# Patient Record
Sex: Female | Born: 1990 | Race: White | Hispanic: No | Marital: Single | Smoking: Never smoker
Health system: Southern US, Community
[De-identification: ages and names within clinical notes are randomized; demographics above are authoritative.]

## PROBLEM LIST (undated history)

## (undated) DIAGNOSIS — F419 Anxiety disorder, unspecified: Secondary | ICD-10-CM

## (undated) DIAGNOSIS — J45990 Exercise induced bronchospasm: Secondary | ICD-10-CM

## (undated) DIAGNOSIS — Z9889 Other specified postprocedural states: Secondary | ICD-10-CM

## (undated) DIAGNOSIS — R112 Nausea with vomiting, unspecified: Secondary | ICD-10-CM

## (undated) DIAGNOSIS — K589 Irritable bowel syndrome without diarrhea: Secondary | ICD-10-CM

## (undated) DIAGNOSIS — J45909 Unspecified asthma, uncomplicated: Secondary | ICD-10-CM

## (undated) DIAGNOSIS — I959 Hypotension, unspecified: Secondary | ICD-10-CM

## (undated) HISTORY — DX: Irritable bowel syndrome without diarrhea: K58.9

## (undated) HISTORY — DX: Hypotension, unspecified: I95.9

## (undated) HISTORY — DX: Anxiety disorder, unspecified: F41.9

## (undated) HISTORY — DX: Exercise induced bronchospasm: J45.990

## (undated) HISTORY — DX: Irritable bowel syndrome, unspecified: K58.9

---
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
## 2014-05-09 ENCOUNTER — Other Ambulatory Visit: Payer: Self-pay | Admitting: Family Medicine

## 2014-05-09 DIAGNOSIS — R1013 Epigastric pain: Secondary | ICD-10-CM

## 2014-05-16 ENCOUNTER — Ambulatory Visit
Admission: RE | Admit: 2014-05-16 | Discharge: 2014-05-16 | Disposition: A | Discharge status: Home / Self Care | Payer: BC Managed Care – PPO | Source: Ambulatory Visit | Attending: Family Medicine | Admitting: Family Medicine

## 2014-05-16 DIAGNOSIS — R1013 Epigastric pain: Secondary | ICD-10-CM

## 2014-06-06 ENCOUNTER — Other Ambulatory Visit (INDEPENDENT_AMBULATORY_CARE_PROVIDER_SITE_OTHER): Payer: Self-pay | Admitting: General Surgery

## 2014-06-18 ENCOUNTER — Encounter (HOSPITAL_COMMUNITY)
Admission: RE | Admit: 2014-06-18 | Discharge: 2014-06-18 | Disposition: A | Discharge status: Home / Self Care | Payer: BC Managed Care – PPO | Source: Ambulatory Visit | Attending: General Surgery | Admitting: General Surgery

## 2014-06-18 ENCOUNTER — Encounter (HOSPITAL_COMMUNITY): Payer: Self-pay

## 2014-06-18 DIAGNOSIS — Z01812 Encounter for preprocedural laboratory examination: Secondary | ICD-10-CM | POA: Insufficient documentation

## 2014-06-18 HISTORY — DX: Other specified postprocedural states: Z98.890

## 2014-06-18 HISTORY — DX: Unspecified asthma, uncomplicated: J45.909

## 2014-06-18 HISTORY — DX: Nausea with vomiting, unspecified: R11.2

## 2014-06-18 LAB — CBC WITH DIFFERENTIAL/PLATELET
Basophils Absolute: 0 10*3/uL (ref 0.0–0.1)
Basophils Relative: 0 % (ref 0–1)
EOS ABS: 0.1 10*3/uL (ref 0.0–0.7)
EOS PCT: 1 % (ref 0–5)
HCT: 38.2 % (ref 36.0–46.0)
HEMOGLOBIN: 13.2 g/dL (ref 12.0–15.0)
LYMPHS ABS: 1.8 10*3/uL (ref 0.7–4.0)
Lymphocytes Relative: 35 % (ref 12–46)
MCH: 29.3 pg (ref 26.0–34.0)
MCHC: 34.6 g/dL (ref 30.0–36.0)
MCV: 84.7 fL (ref 78.0–100.0)
MONOS PCT: 6 % (ref 3–12)
Monocytes Absolute: 0.3 10*3/uL (ref 0.1–1.0)
Neutro Abs: 3 10*3/uL (ref 1.7–7.7)
Neutrophils Relative %: 58 % (ref 43–77)
Platelets: 202 10*3/uL (ref 150–400)
RBC: 4.51 MIL/uL (ref 3.87–5.11)
RDW: 12.2 % (ref 11.5–15.5)
WBC: 5.2 10*3/uL (ref 4.0–10.5)

## 2014-06-18 LAB — COMPREHENSIVE METABOLIC PANEL WITH GFR
ALT: 57 U/L — ABNORMAL HIGH (ref 0–35)
AST: 39 U/L — ABNORMAL HIGH (ref 0–37)
Albumin: 3.8 g/dL (ref 3.5–5.2)
Alkaline Phosphatase: 51 U/L (ref 39–117)
Anion gap: 14 (ref 5–15)
BUN: 16 mg/dL (ref 6–23)
CO2: 22 meq/L (ref 19–32)
Calcium: 9.2 mg/dL (ref 8.4–10.5)
Chloride: 103 meq/L (ref 96–112)
Creatinine, Ser: 1.01 mg/dL (ref 0.50–1.10)
GFR calc Af Amer: >90 mL/min
GFR calc non Af Amer: 79 mL/min — ABNORMAL LOW
Glucose, Bld: 88 mg/dL (ref 70–99)
Potassium: 3.9 meq/L (ref 3.7–5.3)
Sodium: 139 meq/L (ref 137–147)
Total Bilirubin: 0.7 mg/dL (ref 0.3–1.2)
Total Protein: 7 g/dL (ref 6.0–8.3)

## 2014-06-18 LAB — HCG, SERUM, QUALITATIVE: Preg, Serum: NEGATIVE

## 2014-06-18 NOTE — Pre-Procedure Instructions (Addendum)
Lincoln Brighamaylor Biddinger  06/18/2014   Your procedure is scheduled on:  06/24/14  [facility] short [facility] at 1000 AM.  Call this number if you have problems the morning of surgery: [phone]   Remember:   Do not eat food or drink liquids after midnight.   Take these medicines the morning of surgery with A SIP OF WATER: inhaler if needed(bring with you), birth control    Take all meds as ordered until day of surgery except as instructed below or per dr  Despina AriasSTOP all herbel meds, nsaids (aleve,naproxen,advil,ibuprofen) 5 days prior to surgery starting tomorrow including vitamins, aspirin   Do not wear jewelry, make-up or nail polish.  Do not wear lotions, powders, or perfumes. You may wear deodorant.  Do not shave 48 hours prior to surgery. Men may shave face and neck.  Do not bring valuables to the hospital.  [facility] is not responsible                  for any belongings or valuables.               Contacts, dentures or bridgework may not be worn into surgery.  Leave suitcase in the car. After surgery it may be brought to your room.  For patients admitted to the hospital, discharge time is determined by your                treatment team.               Patients discharged the day of surgery will not be allowed to drive  home.  Name and phone number of your driver:   Special Instructions:  Special Instructions:  - Preparing for Surgery  Before surgery, you can play an important role.  Because skin is not sterile, your skin needs to be as free of germs as possible.  You can reduce the number of germs on you skin by washing with CHG (chlorahexidine gluconate) soap before surgery.  CHG is an antiseptic cleaner which kills germs and bonds with the skin to continue killing germs even after washing.  Please DO NOT use if you have an allergy to CHG or antibacterial soaps.  If your skin becomes reddened/irritated stop using the CHG and inform your nurse when you arrive at  Short Stay.  Do not shave (including legs and underarms) for at least 48 hours prior to the first CHG shower.  You may shave your face.  Please follow these instructions carefully:   1.  Shower with CHG Soap the night before surgery and the morning of Surgery.  2.  If you choose to wash your hair, wash your hair first as usual with your normal shampoo.  3.  After you shampoo, rinse your hair and body thoroughly to remove the Shampoo.  4.  Use CHG as you would any other liquid soap.  You can apply chg directly  to the skin and wash gently with scrungie or a clean washcloth.  5.  Apply the CHG Soap to your body ONLY FROM THE NECK DOWN.  Do not use on open wounds or open sores.  Avoid contact with your eyes ears, mouth and genitals (private parts).  Wash genitals (private parts)       with your normal soap.  6.  Wash thoroughly, paying special attention to the area where your surgery will be performed.  7.  Thoroughly rinse your body with warm water from the neck down.  8.  DO NOT shower/wash with your normal soap after using and rinsing off the CHG Soap.  9.  Pat yourself dry with a clean towel.            10.  Wear clean pajamas.            11.  Place clean sheets on your bed the night of your first shower and do not sleep with pets.  Day of Surgery  Do not apply any lotions/deodorants the morning of surgery.  Please wear clean clothes to the hospital/surgery center.   Please read over the following fact sheets that you were given: Pain Booklet, Coughing and Deep Breathing and Surgical Site Infection Prevention

## 2014-06-23 MED ORDER — CEFAZOLIN SODIUM-DEXTROSE 2-3 GM-% IV SOLR
2.0000 g | INTRAVENOUS | Status: AC
  Administered 2014-06-24: 2 g via INTRAVENOUS
  Filled 2014-06-23: qty 50

## 2014-06-23 NOTE — Anesthesia Preprocedure Evaluation (Addendum)
Anesthesia Evaluation  Patient identified by MRN, date of birth, ID band Patient awake    Reviewed: Allergy & Precautions, H&P , NPO status , Patient's Chart, lab work & pertinent test results, reviewed documented beta blocker date and time   History of Anesthesia Complications (+) PONV  Airway Mallampati: I  TM Distance: >3 FB Neck ROM: Full    Dental  (+) Teeth Intact, Dental Advisory Given   Pulmonary  breath sounds clear to auscultation        Cardiovascular Rhythm:Regular     Neuro/Psych    GI/Hepatic   Endo/Other    Renal/GU      Musculoskeletal   Abdominal (+)  Abdomen: soft. Bowel sounds: normal.  Peds  Hematology   Anesthesia Other Findings   Reproductive/Obstetrics                          Anesthesia Physical Anesthesia Plan  ASA: II  Anesthesia Plan: General   Post-op Pain Management:    Induction: Intravenous  Airway Management Planned: Oral ETT  Additional Equipment:   Intra-op Plan:   Post-operative Plan: Extubation in OR  Informed Consent: I have reviewed the patients History and Physical, chart, labs and discussed the procedure including the risks, benefits and alternatives for the proposed anesthesia with the patient or authorized representative who has indicated his/her understanding and acceptance.     Plan Discussed with:   Anesthesia Plan Comments:         Anesthesia Quick Evaluation

## 2014-06-23 NOTE — H&P (Signed)
Debbie Griffith  [facility] Patient #: [id number] DOB: May 27, 1991 Single / Language: [place] P[place] / Race: White Female      History of Present Illness  Patient words: new pt. symptomatic chole.  The patient is a 23 year old female who presents for evaluation of gall stones. This is a very pleasant 23 year old Caucasian female, referred by Dr. Selena BattenWendy McNeil for management of symptomatic gallstones. Her parents are with her today She is a Holiday representativesenior at [employer]. She had a moderately severe attack of epigastric pain and nausea and retching. This lasted 3 or 4 hours. [facility] ED and was discharged after H. pylori test was negative. Subsequently she has had an ultrasound which shows multiple gallstones and echogenic liver suggesting fatty liver. Lab tests showed a normal CBC, AST 68, ALP 140, normal lipase. She has had 3 subsequent attacks, one of these radiated to the back a little bit. She is always nauseated. This is often after fatty meals. She has avoided attacks recently by staying on a very low-fat diet. She is very healthy. Has exercise-induced asthma. She would like to go ahead with cholecystectomy this fall. I have discussed the indications, details, techniques, and numerous risk of the surgery with her. She is aware of the risk of bleeding, infection, conversion to open laparotomy, injury to adjacent organs with major reconstructive surgery, bile leak, wound hernia, and other unforeseen problems. She understands all these issues well. The style of her questions are answered. She agrees with this plan.   Other Problems Asthma  Past Surgical History  No pertinent past surgical history  Diagnostic Studies History  Colonoscopy never Mammogram never Pap Smear never  Allergies No Known Drug Allergies11/13/2015  Medication History Orsythia (0.1-20MG -MCG Tablet, Oral) Active. ProAir HFA (108 (90 Base)MCG/ACT  Aerosol Soln, Inhalation) Active.  Social History Alcohol use Occasional alcohol use. Caffeine use Tea. No drug use Tobacco use Never smoker.  Family History  Arthritis Mother. Colon Polyps Father. Heart disease in female family member before age 21 Hypertension Father. Prostate Cancer Father.  Pregnancy / Birth History Age at menarche 14 years. Contraceptive History Oral contraceptives. Gravida 0 Para 0 Regular periods  Review of Systems General Present- Appetite Loss, Fatigue and Weight Loss. Not Present- Chills, Fever, Night Sweats and Weight Gain. Skin Not Present- Change in Wart/Mole, Dryness, Hives, Jaundice, New Lesions, Non-Healing Wounds, Rash and Ulcer. HEENT Present- Wears glasses/contact lenses. Not Present- Earache, Hearing Loss, Hoarseness, Nose Bleed, Oral Ulcers, Ringing in the Ears, Seasonal Allergies, Sinus Pain, Sore Throat, Visual Disturbances and Yellow Eyes. Respiratory Not Present- Bloody sputum, Chronic Cough, Difficulty Breathing, Snoring and Wheezing. Breast Not Present- Breast Mass, Breast Pain, Nipple Discharge and Skin Changes. Cardiovascular Not Present- Chest Pain, Difficulty Breathing Lying Down, Leg Cramps, Palpitations, Rapid Heart Rate, Shortness of Breath and Swelling of Extremities. Gastrointestinal Present- Constipation. Not Present- Abdominal Pain, Bloating, Bloody Stool, Change in Bowel Habits, Chronic diarrhea, Difficulty Swallowing, Excessive gas, Gets full quickly at meals, Hemorrhoids, Indigestion, Nausea, Rectal Pain and Vomiting. Female Genitourinary Not Present- Frequency, Nocturia, Painful Urination, Pelvic Pain and Urgency. Musculoskeletal Not Present- Back Pain, Joint Pain, Joint Stiffness, Muscle Pain, Muscle Weakness and Swelling of Extremities. Neurological Not Present- Decreased Memory, Fainting, Headaches, Numbness, Seizures, Tingling, Tremor, Trouble walking and Weakness. Psychiatric Not Present- Anxiety, Bipolar,  Change in Sleep Pattern, Depression, Fearful and Frequent crying. Endocrine Not Present- Cold Intolerance, Excessive Hunger, Hair Changes, Heat Intolerance, Hot flashes and New Diabetes. Hematology Not Present-  Easy Bruising, Excessive bleeding, Gland problems, HIV and Persistent Infections.   Vitals  06/06/2014 4:20 PM Weight: 139.25 lb Height: 66in Body Surface Area: 1.72 m Body Mass Index: 22.48 kg/m Temp.: 98.80F  Pulse: 70 (Regular)  BP: 102/80 (Sitting, Left Arm, Standard)    Physical Exam  General Mental Status-Alert. General Appearance-Consistent with stated age. Hydration-Well hydrated. Voice-Normal.  Head and Neck Head-normocephalic, atraumatic with no lesions or palpable masses.  Eye Eyeball - Bilateral-Extraocular movements intact. Sclera/Conjunctiva - Bilateral-No scleral icterus.  Chest and Lung Exam Chest and lung exam reveals -quiet, even and easy respiratory effort with no use of accessory muscles and on auscultation, normal breath sounds, no adventitious sounds and normal vocal resonance. Inspection Chest Wall - Normal. Back - normal.  Cardiovascular Cardiovascular examination reveals -on palpation PMI is normal in location and amplitude, no palpable S3 or S4. Normal cardiac borders., normal heart sounds, regular rate and rhythm with no murmurs, carotid auscultation reveals no bruits and normal pedal pulses bilaterally.  Abdomen Inspection Inspection of the abdomen reveals - No Hernias. Skin - Scar - no surgical scars. Palpation/Percussion Palpation and Percussion of the abdomen reveal - Soft, Non Tender, No Rebound tenderness, No Rigidity (guarding) and No hepatosplenomegaly. Auscultation Auscultation of the abdomen reveals - Bowel sounds normal.  Neurologic Neurologic evaluation reveals -alert and oriented x 3 with no impairment of recent or remote memory. Mental Status-Normal.  Musculoskeletal Normal Exam -  Left-Upper Extremity Strength Normal and Lower Extremity Strength Normal. Normal Exam - Right-Upper Extremity Strength Normal, Lower Extremity Weakness.    Assessment & Plan GALLSTONES (574.20  K80.20) Current Plans  Schedule for Surgery Your attacks of abdominal pain and nausea are almost certainly due to your gallstones. These attacks will continue until you have an operation to remove your gallbladder. Your elevated liver function tests may be due to your gallbladder, but you should have your liver function test checked 2 or 3 months postop to be sure We have discussed the techniques and risks of this surgery. Read over the patient information booklet. We will schedule the surgery at your convenience in the near future ASTHMA, EXERCISE INDUCED (493.81  J45.990)    Angelia MouldHaywood M. Derrell LollingIngram, M.D., [facility] General and Minimally invasive Surgery Breast and Colorectal Surgery Office:   [phone] Pager:   [phone]

## 2014-06-24 ENCOUNTER — Ambulatory Visit (HOSPITAL_COMMUNITY): Payer: BC Managed Care – PPO | Admitting: Anesthesiology

## 2014-06-24 ENCOUNTER — Encounter (HOSPITAL_COMMUNITY): Payer: Self-pay | Admitting: *Deleted

## 2014-06-24 ENCOUNTER — Ambulatory Visit (HOSPITAL_COMMUNITY): Payer: BC Managed Care – PPO

## 2014-06-24 ENCOUNTER — Ambulatory Visit (HOSPITAL_COMMUNITY)
Admission: RE | Admit: 2014-06-24 | Discharge: 2014-06-24 | Disposition: A | Discharge status: Home / Self Care | Payer: BC Managed Care – PPO | Source: Ambulatory Visit | Attending: General Surgery | Admitting: General Surgery

## 2014-06-24 ENCOUNTER — Encounter (HOSPITAL_COMMUNITY)
Admission: RE | Disposition: A | Discharge status: Home / Self Care | Payer: Self-pay | Source: Ambulatory Visit | Attending: General Surgery

## 2014-06-24 DIAGNOSIS — J45909 Unspecified asthma, uncomplicated: Secondary | ICD-10-CM | POA: Diagnosis not present

## 2014-06-24 DIAGNOSIS — K8020 Calculus of gallbladder without cholecystitis without obstruction: Secondary | ICD-10-CM | POA: Diagnosis present

## 2014-06-24 DIAGNOSIS — Z793 Long term (current) use of hormonal contraceptives: Secondary | ICD-10-CM | POA: Insufficient documentation

## 2014-06-24 DIAGNOSIS — K8010 Calculus of gallbladder with chronic cholecystitis without obstruction: Secondary | ICD-10-CM | POA: Insufficient documentation

## 2014-06-24 DIAGNOSIS — K8080 Other cholelithiasis without obstruction: Secondary | ICD-10-CM | POA: Diagnosis present

## 2014-06-24 HISTORY — PX: CHOLECYSTECTOMY: SHX55

## 2014-06-24 SURGERY — LAPAROSCOPIC CHOLECYSTECTOMY WITH INTRAOPERATIVE CHOLANGIOGRAM
Anesthesia: General | Site: Abdomen

## 2014-06-24 MED ORDER — ACETAMINOPHEN 10 MG/ML IV SOLN
INTRAVENOUS | Status: DC | PRN
  Administered 2014-06-24: 1000 mg via INTRAVENOUS

## 2014-06-24 MED ORDER — SCOPOLAMINE 1 MG/3DAYS TD PT72
1.0000 | MEDICATED_PATCH | TRANSDERMAL | Status: DC
  Administered 2014-06-24: 1 via TRANSDERMAL
  Administered 2014-06-24: 1.5 mg via TRANSDERMAL
  Filled 2014-06-24: qty 1

## 2014-06-24 MED ORDER — CHLORHEXIDINE GLUCONATE 4 % EX LIQD
1.0000 "application " | Freq: Once | CUTANEOUS | Status: DC
  Filled 2014-06-24: qty 15

## 2014-06-24 MED ORDER — MEPERIDINE HCL 25 MG/ML IJ SOLN
6.2500 mg | INTRAMUSCULAR | Status: DC | PRN
Start: 2014-06-24 — End: 2014-06-24

## 2014-06-24 MED ORDER — BUPIVACAINE-EPINEPHRINE (PF) 0.5% -1:200000 IJ SOLN
INTRAMUSCULAR | Status: AC
  Filled 2014-06-24: qty 30

## 2014-06-24 MED ORDER — MIDAZOLAM HCL 5 MG/5ML IJ SOLN
INTRAMUSCULAR | Status: DC | PRN
  Administered 2014-06-24: 2 mg via INTRAVENOUS

## 2014-06-24 MED ORDER — FENTANYL CITRATE 0.05 MG/ML IJ SOLN: 25.0000 ug | INTRAMUSCULAR | Status: DC | PRN

## 2014-06-24 MED ORDER — DEXAMETHASONE SODIUM PHOSPHATE 4 MG/ML IJ SOLN
INTRAMUSCULAR | Status: DC | PRN
  Administered 2014-06-24: 4 mg via INTRAVENOUS

## 2014-06-24 MED ORDER — GLYCOPYRROLATE 0.2 MG/ML IJ SOLN
INTRAMUSCULAR | Status: DC | PRN
  Administered 2014-06-24: 0.4 mg via INTRAVENOUS

## 2014-06-24 MED ORDER — SUCCINYLCHOLINE CHLORIDE 20 MG/ML IJ SOLN
INTRAMUSCULAR | Status: DC | PRN
  Administered 2014-06-24: 100 mg via INTRAVENOUS

## 2014-06-24 MED ORDER — LACTATED RINGERS IV SOLN
INTRAVENOUS | Status: DC | PRN
  Administered 2014-06-24: 11:00:00 via INTRAVENOUS

## 2014-06-24 MED ORDER — PROPOFOL 10 MG/ML IV BOLUS
INTRAVENOUS | Status: AC
  Filled 2014-06-24: qty 20

## 2014-06-24 MED ORDER — FENTANYL CITRATE 0.05 MG/ML IJ SOLN
INTRAMUSCULAR | Status: AC
  Filled 2014-06-24: qty 5

## 2014-06-24 MED ORDER — LACTATED RINGERS IV SOLN
INTRAVENOUS | Status: DC
  Administered 2014-06-24: 11:00:00 via INTRAVENOUS

## 2014-06-24 MED ORDER — ROCURONIUM BROMIDE 100 MG/10ML IV SOLN
INTRAVENOUS | Status: DC | PRN
  Administered 2014-06-24: 30 mg via INTRAVENOUS

## 2014-06-24 MED ORDER — FENTANYL CITRATE 0.05 MG/ML IJ SOLN
INTRAMUSCULAR | Status: DC | PRN
  Administered 2014-06-24 (×3): 50 ug via INTRAVENOUS

## 2014-06-24 MED ORDER — DIPHENHYDRAMINE HCL 50 MG/ML IJ SOLN
INTRAMUSCULAR | Status: DC | PRN
  Administered 2014-06-24: 25 mg via INTRAVENOUS

## 2014-06-24 MED ORDER — PHENYLEPHRINE HCL 10 MG/ML IJ SOLN
INTRAMUSCULAR | Status: DC | PRN
  Administered 2014-06-24 (×2): 40 ug via INTRAVENOUS

## 2014-06-24 MED ORDER — ROCURONIUM BROMIDE 50 MG/5ML IV SOLN
INTRAVENOUS | Status: AC
  Filled 2014-06-24: qty 1

## 2014-06-24 MED ORDER — LIDOCAINE HCL (CARDIAC) 20 MG/ML IV SOLN
INTRAVENOUS | Status: AC
  Filled 2014-06-24: qty 5

## 2014-06-24 MED ORDER — ONDANSETRON HCL 4 MG/2ML IJ SOLN
INTRAMUSCULAR | Status: DC | PRN
  Administered 2014-06-24: 4 mg via INTRAVENOUS

## 2014-06-24 MED ORDER — NEOSTIGMINE METHYLSULFATE 10 MG/10ML IV SOLN
INTRAVENOUS | Status: DC | PRN
  Administered 2014-06-24: 3.5 mg via INTRAVENOUS

## 2014-06-24 MED ORDER — 0.9 % SODIUM CHLORIDE (POUR BTL) OPTIME
TOPICAL | Status: DC | PRN
  Administered 2014-06-24: 1000 mL

## 2014-06-24 MED ORDER — BUPIVACAINE-EPINEPHRINE 0.5% -1:200000 IJ SOLN
INTRAMUSCULAR | Status: DC | PRN
  Administered 2014-06-24: 30 mL

## 2014-06-24 MED ORDER — MIDAZOLAM HCL 2 MG/2ML IJ SOLN
INTRAMUSCULAR | Status: AC
  Filled 2014-06-24: qty 2

## 2014-06-24 MED ORDER — LIDOCAINE HCL (CARDIAC) 20 MG/ML IV SOLN
INTRAVENOUS | Status: DC | PRN
  Administered 2014-06-24: 60 mg via INTRAVENOUS

## 2014-06-24 MED ORDER — SODIUM CHLORIDE 0.9 % IV SOLN
INTRAVENOUS | Status: DC | PRN
  Administered 2014-06-24: 50 mL

## 2014-06-24 MED ORDER — PROPOFOL 10 MG/ML IV BOLUS
INTRAVENOUS | Status: DC | PRN
  Administered 2014-06-24: 150 mg via INTRAVENOUS

## 2014-06-24 MED ORDER — PROMETHAZINE HCL 25 MG/ML IJ SOLN: 6.2500 mg | INTRAMUSCULAR | Status: DC | PRN

## 2014-06-24 MED ORDER — SODIUM CHLORIDE 0.9 % IR SOLN
Status: DC | PRN
  Administered 2014-06-24: 1000 mL

## 2014-06-24 MED ORDER — SUCCINYLCHOLINE CHLORIDE 20 MG/ML IJ SOLN
INTRAMUSCULAR | Status: AC
  Filled 2014-06-24: qty 1

## 2014-06-24 MED ORDER — HYDROCODONE-ACETAMINOPHEN 5-325 MG PO TABS: 1.0000 | ORAL_TABLET | Freq: Four times a day (QID) | ORAL | Status: DC | PRN

## 2014-06-24 SURGICAL SUPPLY — 46 items
APPLIER CLIP ROT 10 11.4 M/L (STAPLE) ×3
BLADE SURG ROTATE 9660 (MISCELLANEOUS) ×3 IMPLANT
CANISTER SUCTION 2500CC (MISCELLANEOUS) ×3 IMPLANT
CHLORAPREP W/TINT 26ML (MISCELLANEOUS) ×3 IMPLANT
CLIP APPLIE ROT 10 11.4 M/L (STAPLE) ×1 IMPLANT
COVER MAYO STAND STRL (DRAPES) ×3 IMPLANT
COVER SURGICAL LIGHT HANDLE (MISCELLANEOUS) ×6 IMPLANT
DECANTER SPIKE VIAL GLASS SM (MISCELLANEOUS) ×6 IMPLANT
DERMABOND ADVANCED (GAUZE/BANDAGES/DRESSINGS) ×2
DERMABOND ADVANCED .7 DNX12 (GAUZE/BANDAGES/DRESSINGS) ×1 IMPLANT
DRAPE C-ARM 42X72 X-RAY (DRAPES) ×3 IMPLANT
DRAPE LAPAROSCOPIC ABDOMINAL (DRAPES) ×3 IMPLANT
DRAPE UTILITY XL STRL (DRAPES) ×6 IMPLANT
ELECT REM PT RETURN 9FT ADLT (ELECTROSURGICAL) ×3
ELECTRODE REM PT RTRN 9FT ADLT (ELECTROSURGICAL) ×1 IMPLANT
GLOVE BIOGEL PI IND STRL 7.0 (GLOVE) ×2 IMPLANT
GLOVE BIOGEL PI IND STRL 7.5 (GLOVE) ×1 IMPLANT
GLOVE BIOGEL PI IND STRL 8 (GLOVE) ×1 IMPLANT
GLOVE BIOGEL PI INDICATOR 7.0 (GLOVE) ×4
GLOVE BIOGEL PI INDICATOR 7.5 (GLOVE) ×2
GLOVE BIOGEL PI INDICATOR 8 (GLOVE) ×2
GLOVE ECLIPSE 8.0 STRL XLNG CF (GLOVE) ×3 IMPLANT
GLOVE EUDERMIC 7 POWDERFREE (GLOVE) ×3 IMPLANT
GLOVE SURG SS PI 7.0 STRL IVOR (GLOVE) ×3 IMPLANT
GOWN STRL REUS W/ TWL LRG LVL3 (GOWN DISPOSABLE) ×3 IMPLANT
GOWN STRL REUS W/ TWL XL LVL3 (GOWN DISPOSABLE) ×1 IMPLANT
GOWN STRL REUS W/TWL LRG LVL3 (GOWN DISPOSABLE) ×6
GOWN STRL REUS W/TWL XL LVL3 (GOWN DISPOSABLE) ×2
KIT BASIN OR (CUSTOM PROCEDURE TRAY) ×3 IMPLANT
KIT ROOM TURNOVER OR (KITS) ×3 IMPLANT
NS IRRIG 1000ML POUR BTL (IV SOLUTION) ×3 IMPLANT
PAD ARMBOARD 7.5X6 YLW CONV (MISCELLANEOUS) ×3 IMPLANT
POUCH SPECIMEN RETRIEVAL 10MM (ENDOMECHANICALS) ×3 IMPLANT
SCISSORS LAP 5X35 DISP (ENDOMECHANICALS) ×3 IMPLANT
SET CHOLANGIOGRAPH 5 50 .035 (SET/KITS/TRAYS/PACK) ×3 IMPLANT
SET IRRIG TUBING LAPAROSCOPIC (IRRIGATION / IRRIGATOR) ×3 IMPLANT
SLEEVE ENDOPATH XCEL 5M (ENDOMECHANICALS) ×6 IMPLANT
SPECIMEN JAR SMALL (MISCELLANEOUS) ×3 IMPLANT
SUT MNCRL AB 4-0 PS2 18 (SUTURE) ×6 IMPLANT
TOWEL OR 17X24 6PK STRL BLUE (TOWEL DISPOSABLE) ×3 IMPLANT
TOWEL OR 17X26 10 PK STRL BLUE (TOWEL DISPOSABLE) ×3 IMPLANT
TRAY LAPAROSCOPIC (CUSTOM PROCEDURE TRAY) ×3 IMPLANT
TROCAR XCEL BLUNT TIP 100MML (ENDOMECHANICALS) ×3 IMPLANT
TROCAR XCEL NON-BLD 11X100MML (ENDOMECHANICALS) ×3 IMPLANT
TROCAR XCEL NON-BLD 5MMX100MML (ENDOMECHANICALS) ×3 IMPLANT
TUBING INSUFFLATION (TUBING) ×3 IMPLANT

## 2014-06-24 NOTE — Anesthesia Procedure Notes (Signed)
Procedure Name: Intubation Date/Time: 06/24/2014 12:10 PM Performed by: Ellin GoodieWEAVER, Ludivina Guymon M Pre-anesthesia Checklist: Patient identified, Emergency Drugs available, Suction available, Patient being monitored and Timeout performed Patient Re-evaluated:Patient Re-evaluated prior to inductionOxygen Delivery Method: Circle system utilized Preoxygenation: Pre-oxygenation with 100% oxygen Intubation Type: IV induction Ventilation: Mask ventilation without difficulty Laryngoscope Size: Mac and 3 Grade View: Grade I Tube type: Oral Tube size: 7.5 mm Number of attempts: 1 Airway Equipment and Method: Stylet Placement Confirmation: ETT inserted through vocal cords under direct vision,  positive ETCO2 and breath sounds checked- equal and bilateral Secured at: 21 cm Tube secured with: Tape Dental Injury: Teeth and Oropharynx as per pre-operative assessment  Comments: Easy atraumatic induction and intubation with MAC 3 blade.  Dr. Berneice HeinrichManny verified placement.  Carlynn HeraldH Weaver, CRNA

## 2014-06-24 NOTE — Discharge Instructions (Signed)
[facility] ______[facility] °LAPAROSCOPIC SURGERY: POST OP INSTRUCTIONS °Always review your discharge instruction sheet given to you by the facility where your surgery was performed. °IF YOU HAVE DISABILITY OR FAMILY LEAVE FORMS, YOU MUST BRING THEM TO THE OFFICE FOR PROCESSING.   °DO NOT GIVE THEM TO YOUR DOCTOR. ° °1. A prescription for pain medication may be given to you upon discharge.  Take your pain medication as prescribed, if needed.  If narcotic pain medicine is not needed, then you may take acetaminophen (Tylenol) or ibuprofen (Advil) as needed. °2. Take your usually prescribed medications unless otherwise directed. °3. If you need a refill on your pain medication, please contact your pharmacy.  They will contact our office to request authorization. Prescriptions will not be filled after 5pm or on week-ends. °4. You should follow a light diet the first few days after arrival home, such as soup and crackers, etc.  Be sure to include lots of fluids daily. °5. Most patients will experience some swelling and bruising in the area of the incisions.  Ice packs will help.  Swelling and bruising can take several days to resolve.  °6. It is common to experience some constipation if taking pain medication after surgery.  Increasing fluid intake and taking a stool softener (such as Colace) will usually help or prevent this problem from occurring.  A mild laxative (Milk of Magnesia or Miralax) should be taken according to package instructions if there are no bowel movements after 48 hours. °7. Unless discharge instructions indicate otherwise, you may remove your bandages 24-48 hours after surgery, and you may shower at that time.  You may have steri-strips (small skin tapes) in place directly over the incision.  These strips should be left on the skin for 7-10 days.  If your surgeon used skin glue on the incision, you may shower in 24 hours.  The glue will flake off over the next 2-3 weeks.  Any sutures or  staples will be removed at the office during your follow-up visit. °8. ACTIVITIES:  You may resume regular (light) daily activities beginning the next day--such as daily self-care, walking, climbing stairs--gradually increasing activities as tolerated.  You may have sexual intercourse when it is comfortable.  Refrain from any heavy lifting or straining until approved by your doctor. °a. You may drive when you are no longer taking prescription pain medication, you can comfortably wear a seatbelt, and you can safely maneuver your car and apply brakes. °b. RETURN TO WORK:  __________________________________________________________ °9. You should see your doctor in the office for a follow-up appointment approximately 2-3 weeks after your surgery.  Make sure that you call for this appointment within a day or two after you arrive home to insure a convenient appointment time. °10. OTHER INSTRUCTIONS: __________________________________________________________________________________________________________________________ __________________________________________________________________________________________________________________________ °WHEN TO CALL YOUR DOCTOR: °1. Fever over 101.0 °2. Inability to urinate °3. Continued bleeding from incision. °4. Increased pain, redness, or drainage from the incision. °5. Increasing abdominal pain ° °The clinic staff is available to answer your questions during regular business hours.  Please don’t hesitate to call and ask to speak to one of the nurses for clinical concerns.  If you have a medical emergency, go to the nearest emergency room or call 911.  A surgeon from [facility] is always on call at the hospital. °[address], [address], [place], [place]  [address] ? [address] [address], , [place]   [address] °[phone] ? [phone] ? FAX [phone] °Web site:   [email or web address] ° °General Anesthesia, Adult, Care After  °Refer to this  sheet in the next few weeks. These instructions provide you with information on caring for yourself after your procedure. Your health care provider may also give you more specific instructions. Your treatment has been planned according to current medical practices, but problems sometimes occur. Call your health care provider if you have any problems or questions after your procedure.  °WHAT TO EXPECT AFTER THE PROCEDURE  °After the procedure, it is typical to experience:  °Sleepiness.  °Nausea and vomiting. °HOME CARE INSTRUCTIONS  °For the first 24 hours after general anesthesia:  °Have a responsible person with you.  °Do not drive a car. If you are alone, do not take public transportation.  °Do not drink alcohol.  °Do not take medicine that has not been prescribed by your health care provider.  °Do not sign important papers or make important decisions.  °You may resume a normal diet and activities as directed by your health care provider.  °Change bandages (dressings) as directed.  °If you have questions or problems that seem related to general anesthesia, call the hospital and ask for the anesthetist or anesthesiologist on call. °SEEK MEDICAL CARE IF:  °You have nausea and vomiting that continue the day after anesthesia.  °You develop a rash. °SEEK IMMEDIATE MEDICAL CARE IF:  °You have difficulty breathing.  °You have chest pain.  °You have any allergic problems. °Document Released: 10/17/2000 Document Revised: 03/13/2013 Document Reviewed: 01/24/2013  °Exit[employer]® Patient Information ©2014 [employer].  ° ° °

## 2014-06-24 NOTE — Op Note (Signed)
Patient Name:           Debbie Griffith   Date of Surgery:        06/24/2014  Pre op Diagnosis:      Chronic cholecystitis with cholelithiasis  Post op Diagnosis:    Same  Procedure:                 Laparoscopic cholecystectomy with cholangiogram  Surgeon:                     Angelia MouldHaywood M. Derrell LollingIngram, M.D., FACS  Assistant:                      Avel Peaceodd Rosenbower, M.D.  Operative Indications:  This is a very pleasant 23 year old Caucasian female, referred by Dr. Selena BattenWendy McNeil for management of symptomatic gallstones. Her parents are with her today She is a Holiday representativesenior at [employer]. She recently had a moderately severe attack of epigastric pain and nausea and retching. This lasted 3 or 4 hours. [facility] and was discharged after H. pylori test was negative. Subsequently she has had an ultrasound which shows multiple gallstones and echogenic liver suggesting fatty liver. Lab tests showed a normal CBC, AST 68, ALP 140, normal lipase. She has had 3 subsequent attacks, one of these radiated to the back a little bit. She is always nauseated. This is often after fatty meals. She has avoided attacks recently by staying on a very low-fat diet. She is very healthy. Has exercise-induced asthma. She would like to go ahead with cholecystectomy.   She is brought to the operating room electively  Operative Findings:       The gallbladder was discolored and chronically inflamed but was thin-walled. The gallbladder contained multiple palpable stones. The cholangiogram was normal, showing normal intrahepatic and extrahepatic biliary anatomy, no filling defects, and no obstruction with good flow of contrast into the duodenum. The liver, stomach, duodenum, small intestine, large intestine were grossly normal to inspection.  Procedure in Detail:          Following the induction of general endotracheal anesthesia the patient's abdomen was prepped and draped in a sterile fashion,  intravenous antibiotics were given, and a surgical timeout was performed. 0.5% Marcaine with epinephrine was used as a local infiltration anesthetic.     A short vertical incision was made inside the lower rim of the umbilicus. The fascia was incised in the midline. The abdominal cavity was entered under direct vision. An 11 mm Hassan trocar was inserted and secured with the Purstring suture of 0 Vicryl. Pneumoperitoneum was created and video camera was inserted. An 11 mm trocar was placed in subxiphoid region and two 5 mm trochars were placed in the right upper quadrant.     The gallbladder fundus was elevated. The infundibulum was retracted laterally. A few adhesions were taken down. The peritoneum overlying the neck of the gallbladder was carefully incised. We could visualize the cystic duct and the common bile duct which was fairly close. We slowly dissected out the cystic duct and the cystic artery and created a nice window behind these 2 structures, creating a good critical view. The cholangiogram catheter was inserted into the cystic duct and the cholangiogram was obtained using the C-arm. The cholangiogram was normal as described above. The cholangiogram catheter was removed, the cystic duct was secured with multiple medical clips and divided. Cystic artery was isolated, secured with multiple medical clips  and divided. Gallbladder was dissected from its bed. Electrocautery was used. About halfway up there was a firm small tubular structure and I wasn't sure if this was an accessory duct but it was clipped and then divided. The gallbladder was further dissected out of its bed placed in a specimen bag and removed. The operative field was irrigated. We cauterized a few small bleeding points until  the irrigation fluid was completely clear.    there was no bleeding or bile leak. Irrigation fluid was removed. The pneumoperitoneum was released. The trochars were removed. The fascia at the umbilicus was closed  with 0 Vicryl sutures. Skin incisions were closed with subcuticular sutures of 4-0 Monocryl and Dermabond. The patient tolerated the procedure well was taken to PACU in stable condition. EBL 10 mL. Counts correct. Complications none.          Angelia MouldHaywood M. Derrell LollingIngram, M.D., FACS General and Minimally Invasive Surgery Breast and Colorectal Surgery  06/24/2014 1:21 PM

## 2014-06-24 NOTE — Interval H&P Note (Signed)
History and Physical Interval Note:  06/24/2014 11:26 AM  Debbie Griffith  has presented today for surgery, with the diagnosis of gallstones  The various methods of treatment have been discussed with the patient and family. After consideration of risks, benefits and other options for treatment, the patient has consented to  Procedure(s): LAPAROSCOPIC CHOLECYSTECTOMY WITH INTRAOPERATIVE CHOLANGIOGRAM POSSIBLE OPEN (N/A) as a surgical intervention .  The patient's history has been reviewed, patient examined, no change in status, stable for surgery.  I have reviewed the patient's chart and labs.  Questions were answered to the patient's satisfaction.     Ernestene MentionINGRAM,[name] M

## 2014-06-24 NOTE — Interval H&P Note (Signed)
History and Physical Interval Note:  06/24/2014 11:26 AM  Debbie Griffith  has presented today for surgery, with the diagnosis of gallstones  The various methods of treatment have been discussed with the patient and family. After consideration of risks, benefits and other options for treatment, the patient has consented to  Procedure(s): LAPAROSCOPIC CHOLECYSTECTOMY WITH INTRAOPERATIVE CHOLANGIOGRAM POSSIBLE OPEN (N/A) as a surgical intervention .  The patient's history has been reviewed, patient examined, no change in status, stable for surgery.  I have reviewed the patient's chart and labs.  Questions were answered to the patient's satisfaction.     Thedora Rings M   

## 2014-06-24 NOTE — Transfer of Care (Signed)
Immediate Anesthesia Transfer of Care Note  Patient: Debbie Griffith  Procedure(s) Performed: Procedure(s): LAPAROSCOPIC CHOLECYSTECTOMY WITH INTRAOPERATIVE CHOLANGIOGRAM  (N/A)  Patient Location: PACU  Anesthesia Type:General  Level of Consciousness: awake, alert  and sedated  Airway & Oxygen Therapy: Patient connected to nasal cannula oxygen  Post-op Assessment: Report given to PACU RN  Post vital signs: stable  Complications: No apparent anesthesia complications

## 2014-06-24 NOTE — Anesthesia Postprocedure Evaluation (Signed)
  Anesthesia Post-op Note  Patient: Debbie Griffith  Procedure(s) Performed: Procedure(s): LAPAROSCOPIC CHOLECYSTECTOMY WITH INTRAOPERATIVE CHOLANGIOGRAM  (N/A)  Patient Location: PACU  Anesthesia Type:General  Level of Consciousness: awake and alert   Airway and Oxygen Therapy: Patient Spontanous Breathing and Patient connected to nasal cannula oxygen  Post-op Pain: mild  Post-op Assessment: Post-op Vital signs reviewed, Patient's Cardiovascular Status Stable and Respiratory Function Stable  Post-op Vital Signs: Reviewed and stable  Last Vitals:  Filed Vitals:   06/24/14 1345  BP: 107/57  Pulse: 53  Temp:   Resp: 12    Complications: No apparent anesthesia complications

## 2014-06-25 ENCOUNTER — Encounter (HOSPITAL_COMMUNITY): Payer: Self-pay | Admitting: General Surgery

## 2014-12-25 ENCOUNTER — Other Ambulatory Visit: Payer: Self-pay | Admitting: Physician Assistant

## 2014-12-25 ENCOUNTER — Other Ambulatory Visit (HOSPITAL_COMMUNITY)
Admission: RE | Admit: 2014-12-25 | Discharge: 2014-12-25 | Disposition: A | Discharge status: Home / Self Care | Payer: BLUE CROSS/BLUE SHIELD | Source: Ambulatory Visit | Attending: Physician Assistant | Admitting: Physician Assistant

## 2014-12-25 DIAGNOSIS — Z124 Encounter for screening for malignant neoplasm of cervix: Secondary | ICD-10-CM | POA: Diagnosis present

## 2014-12-25 DIAGNOSIS — Z113 Encounter for screening for infections with a predominantly sexual mode of transmission: Secondary | ICD-10-CM | POA: Insufficient documentation

## 2014-12-29 LAB — CYTOLOGY - PAP

## 2016-01-01 DIAGNOSIS — R198 Other specified symptoms and signs involving the digestive system and abdomen: Secondary | ICD-10-CM | POA: Insufficient documentation

## 2016-01-01 DIAGNOSIS — J45990 Exercise induced bronchospasm: Secondary | ICD-10-CM | POA: Insufficient documentation

## 2016-01-01 DIAGNOSIS — Z9049 Acquired absence of other specified parts of digestive tract: Secondary | ICD-10-CM | POA: Insufficient documentation

## 2016-01-14 ENCOUNTER — Encounter: Payer: Self-pay | Admitting: *Deleted

## 2016-01-14 DIAGNOSIS — K589 Irritable bowel syndrome without diarrhea: Secondary | ICD-10-CM | POA: Insufficient documentation

## 2016-01-14 DIAGNOSIS — J45990 Exercise induced bronchospasm: Secondary | ICD-10-CM | POA: Insufficient documentation

## 2016-01-28 ENCOUNTER — Institutional Professional Consult (permissible substitution): Payer: BLUE CROSS/BLUE SHIELD | Admitting: Internal Medicine

## 2016-07-26 ENCOUNTER — Encounter: Payer: Self-pay | Admitting: Certified Nurse Midwife

## 2016-07-26 ENCOUNTER — Ambulatory Visit (INDEPENDENT_AMBULATORY_CARE_PROVIDER_SITE_OTHER): Payer: BLUE CROSS/BLUE SHIELD | Admitting: Certified Nurse Midwife

## 2016-07-26 VITALS — BP 110/80 | HR 76 | Resp 14 | Ht 66.5 in | Wt 131.0 lb

## 2016-07-26 DIAGNOSIS — Z124 Encounter for screening for malignant neoplasm of cervix: Secondary | ICD-10-CM | POA: Diagnosis not present

## 2016-07-26 DIAGNOSIS — Z01419 Encounter for gynecological examination (general) (routine) without abnormal findings: Secondary | ICD-10-CM | POA: Diagnosis not present

## 2016-07-26 DIAGNOSIS — Z3041 Encounter for surveillance of contraceptive pills: Secondary | ICD-10-CM | POA: Diagnosis not present

## 2016-07-26 MED ORDER — LEVONORGESTREL-ETHINYL ESTRAD 0.1-20 MG-MCG PO TABS: 1.0000 | ORAL_TABLET | Freq: Every day | ORAL | 4 refills | Status: DC

## 2016-07-26 NOTE — Patient Instructions (Addendum)
General topics  Next pap or exam is  due in 1 year Take a Women's multivitamin Take 1200 mg. of calcium daily - prefer dietary If any concerns in interim to call back  Breast Self-Awareness Practicing breast self-awareness may pick up problems early, prevent significant medical complications, and possibly save your life. By practicing breast self-awareness, you can become familiar with how your breasts look and feel and if your breasts are changing. This allows you to notice changes early. It can also offer you some reassurance that your breast health is good. One way to learn what is normal for your breasts and whether your breasts are changing is to do a breast self-exam. If you find a lump or something that was not present in the past, it is best to contact your caregiver right away. Other findings that should be evaluated by your caregiver include nipple discharge, especially if it is bloody; skin changes or reddening; areas where the skin seems to be pulled in (retracted); or new lumps and bumps. Breast pain is seldom associated with cancer (malignancy), but should also be evaluated by a caregiver. BREAST SELF-EXAM The best time to examine your breasts is 5 7 days after your menstrual period is over.  ExitCare Patient Information 2013 [employer].   Exercise to Stay Healthy Exercise helps you become and stay healthy. EXERCISE IDEAS AND TIPS Choose exercises that:  You enjoy.  Fit into your day. You do not need to exercise really hard to be healthy. You can do exercises at a slow or medium level and stay healthy. You can:  Stretch before and after working out.  Try yoga, Pilates, or tai chi.  Lift weights.  Walk fast, swim, jog, run, climb stairs, bicycle, dance, or rollerskate.  Take aerobic classes. Exercises that burn about 150 calories:  Running 1  miles in 15 minutes.  Playing volleyball for 45 to 60 minutes.  Washing and waxing a car for 45 to 60  minutes.  Playing touch football for 45 minutes.  Walking 1  miles in 35 minutes.  Pushing a stroller 1  miles in 30 minutes.  Playing basketball for 30 minutes.  Raking leaves for 30 minutes.  Bicycling 5 miles in 30 minutes.  Walking 2 miles in 30 minutes.  Dancing for 30 minutes.  Shoveling snow for 15 minutes.  Swimming laps for 20 minutes.  Walking up stairs for 15 minutes.  Bicycling 4 miles in 15 minutes.  Gardening for 30 to 45 minutes.  Jumping rope for 15 minutes.  Washing windows or floors for 45 to 60 minutes. Document Released: 08/13/2010 Document Revised: 10/03/2011 Document Reviewed: 08/13/2010 ExitCare Patient Information 2013 [employer].   Other topics ( that may be useful information):    Sexually Transmitted Disease Sexually transmitted disease (STD) refers to any infection that is passed from person to person during sexual activity. This may happen by way of saliva, semen, blood, vaginal mucus, or urine. Common STDs include:  Gonorrhea.  Chlamydia.  Syphilis.  HIV/AIDS.  Genital herpes.  Hepatitis B and C.  Trichomonas.  Human papillomavirus (HPV).  Pubic lice. CAUSES  An STD may be spread by bacteria, virus, or parasite. A person can get an STD by:  Sexual intercourse with an infected person.  Sharing sex toys with an infected person.  Sharing needles with an infected person.  Having intimate contact with the genitals, mouth, or rectal areas of an infected person. SYMPTOMS  Some people may not have any symptoms, but   they can still pass the infection to others. Different STDs have different symptoms. Symptoms include:  Painful or bloody urination.  Pain in the pelvis, abdomen, vagina, anus, throat, or eyes.  Skin rash, itching, irritation, growths, or sores (lesions). These usually occur in the genital or anal area.  Abnormal vaginal discharge.  Penile discharge in men.  Soft, flesh-colored skin growths in the  genital or anal area.  Fever.  Pain or bleeding during sexual intercourse.  Swollen glands in the groin area.  Yellow skin and eyes (jaundice). This is seen with hepatitis. DIAGNOSIS  To make a diagnosis, your caregiver may:  Take a medical history.  Perform a physical exam.  Take a specimen (culture) to be examined.  Examine a sample of discharge under a microscope.  Perform blood test TREATMENT   Chlamydia, gonorrhea, trichomonas, and syphilis can be cured with antibiotic medicine.  Genital herpes, hepatitis, and HIV can be treated, but not cured, with prescribed medicines. The medicines will lessen the symptoms.  Genital warts from HPV can be treated with medicine or by freezing, burning (electrocautery), or surgery. Warts may come back.  HPV is a virus and cannot be cured with medicine or surgery.However, abnormal areas may be followed very closely by your caregiver and may be removed from the cervix, vagina, or vulva through office procedures or surgery. If your diagnosis is confirmed, your recent sexual partners need treatment. This is true even if they are symptom-free or have a negative culture or evaluation. They should not have sex until their caregiver says it is okay. HOME CARE INSTRUCTIONS  All sexual partners should be informed, tested, and treated for all STDs.  Take your antibiotics as directed. Finish them even if you start to feel better.  Only take over-the-counter or prescription medicines for pain, discomfort, or fever as directed by your caregiver.  Rest.  Eat a balanced diet and drink enough fluids to keep your urine clear or pale yellow.  Do not have sex until treatment is completed and you have followed up with your caregiver. STDs should be checked after treatment.  Keep all follow-up appointments, Pap tests, and blood tests as directed by your caregiver.  Only use latex condoms and water-soluble lubricants during sexual activity. Do not use  petroleum jelly or oils.  Avoid alcohol and illegal drugs.  Get vaccinated for HPV and hepatitis. If you have not received these vaccines in the past, talk to your caregiver about whether one or both might be right for you.  Avoid risky sex practices that can break the skin. The only way to avoid getting an STD is to avoid all sexual activity.Latex condoms and dental dams (for oral sex) will help lessen the risk of getting an STD, but will not completely eliminate the risk. SEEK MEDICAL CARE IF:   You have a fever.  You have any new or worsening symptoms. Document Released: 10/01/2002 Document Revised: 10/03/2011 Document Reviewed: 10/08/2010 [facility] Patient Information 2013 [employer].    Domestic Abuse You are being battered or abused if someone close to you hits, pushes, or physically hurts you in any way. You also are being abused if you are forced into activities. You are being sexually abused if you are forced to have sexual contact of any kind. You are being emotionally abused if you are made to feel worthless or if you are constantly threatened. It is important to remember that help is available. No one has the right to abuse you. PREVENTION OF FURTHER  ABUSE  Learn the warning signs of danger. This varies with situations but may include: the use of alcohol, threats, isolation from friends and family, or forced sexual contact. Leave if you feel that violence is going to occur.  If you are attacked or beaten, report it to the police so the abuse is documented. You do not have to press charges. The police can protect you while you or the attackers are leaving. Get the officer's name and badge number and a copy of the report.  Find someone you can trust and tell them what is happening to you: your caregiver, a nurse, clergy member, close friend or family member. Feeling ashamed is natural, but remember that you have done nothing wrong. No one deserves abuse. Document Released:  07/08/2000 Document Revised: 10/03/2011 Document Reviewed: 09/16/2010 ExitCare Patient Information 2013 [employer].    How Much is Too Much Alcohol? Drinking too much alcohol can cause injury, accidents, and health problems. These types of problems can include:   Car crashes.  Falls.  Family fighting (domestic violence).  Drowning.  Fights.  Injuries.  Burns.  Damage to certain organs.  Having a baby with birth defects. ONE DRINK CAN BE TOO MUCH WHEN YOU ARE:  Working.  Pregnant or breastfeeding.  Taking medicines. Ask your doctor.  Driving or planning to drive. If you or someone you know has a drinking problem, get help from a doctor.  Document Released: 05/07/2009 Document Revised: 10/03/2011 Document Reviewed: 05/07/2009 ExitCare Patient Information 2013 [employer].   Smoking Hazards Smoking cigarettes is extremely bad for your health. Tobacco smoke has over 200 known poisons in it. There are over 60 chemicals in tobacco smoke that cause cancer. Some of the chemicals found in cigarette smoke include:   Cyanide.  Benzene.  Formaldehyde.  Methanol (wood alcohol).  Acetylene (fuel used in welding torches).  Ammonia. Cigarette smoke also contains the poisonous gases nitrogen oxide and carbon monoxide.  Cigarette smokers have an increased risk of many serious medical problems and Smoking causes approximately:  90% of all lung cancer deaths in men.  80% of all lung cancer deaths in women.  90% of deaths from chronic obstructive lung disease. Compared with nonsmokers, smoking increases the risk of:  Coronary heart disease by 2 to 4 times.  Stroke by 2 to 4 times.  Men developing lung cancer by 23 times.  Women developing lung cancer by 13 times.  Dying from chronic obstructive lung diseases by 12 times.  . Smoking is the most preventable cause of death and disease in our society.  WHY IS SMOKING ADDICTIVE?  Nicotine is the chemical  agent in tobacco that is capable of causing addiction or dependence.  When you smoke and inhale, nicotine is absorbed rapidly into the bloodstream through your lungs. Nicotine absorbed through the lungs is capable of creating a powerful addiction. Both inhaled and non-inhaled nicotine may be addictive.  Addiction studies of cigarettes and spit tobacco show that addiction to nicotine occurs mainly during the teen years, when young people begin using tobacco products. WHAT ARE THE BENEFITS OF QUITTING?  There are many health benefits to quitting smoking.   Likelihood of developing cancer and heart disease decreases. Health improvements are seen almost immediately.  Blood pressure, pulse rate, and breathing patterns start returning to normal soon after quitting. QUITTING SMOKING   American Lung Association - 1-800-LUNGUSA  American Cancer Society - 1-800-ACS-2345 Document Released: 08/18/2004 Document Revised: 10/03/2011 Document Reviewed: 04/22/2009 ExitCare Patient Information 2013 Exit[employer].   Stress Management Stress is a state of physical or mental tension that often results from changes in your life or normal routine. Some common causes of stress are:  Death of a loved one.  Injuries or severe illnesses.  Getting fired or changing jobs.  Moving into a new home. Other causes may be:  Sexual problems.  Business or financial losses.  Taking on a large debt.  Regular conflict with someone at home or at work.  Constant tiredness from lack of sleep. It is not just bad things that are stressful. It may be stressful to:  Win the lottery.  Get married.  Buy a new car. The amount of stress that can be easily tolerated varies from person to person. Changes generally cause stress, regardless of the types of change. Too much stress can affect your health. It may lead to physical or emotional problems. Too little stress (boredom) may also become stressful. SUGGESTIONS TO  REDUCE STRESS:  Talk things over with your family and friends. It often is helpful to share your concerns and worries. If you feel your problem is serious, you may want to get help from a professional counselor.  Consider your problems one at a time instead of lumping them all together. Trying to take care of everything at once may seem impossible. List all the things you need to do and then start with the most important one. Set a goal to accomplish 2 or 3 things each day. If you expect to do too many in a single day you will naturally fail, causing you to feel even more stressed.  Do not use alcohol or drugs to relieve stress. Although you may feel better for a short time, they do not remove the problems that caused the stress. They can also be habit forming.  Exercise regularly - at least 3 times per week. Physical exercise can help to relieve that "uptight" feeling and will relax you.  The shortest distance between despair and hope is often a good night's sleep.  Go to bed and get up on time allowing yourself time for appointments without being rushed.  Take a short "time-out" period from any stressful situation that occurs during the day. Close your eyes and take some deep breaths. Starting with the muscles in your face, tense them, hold it for a few seconds, then relax. Repeat this with the muscles in your neck, shoulders, hand, stomach, back and legs.  Take good care of yourself. Eat a balanced diet and get plenty of rest.  Schedule time for having fun. Take a break from your daily routine to relax. HOME CARE INSTRUCTIONS   Call if you feel overwhelmed by your problems and feel you can no longer manage them on your own.  Return immediately if you feel like hurting yourself or someone else. Document Released: 01/04/2001 Document Revised: 10/03/2011 Document Reviewed: 08/27/2007 [facility] Patient Information 2013 [employer].   Oral Contraception Use Oral contraceptive pills  (OCPs) are medicines taken to prevent pregnancy. OCPs work by preventing the ovaries from releasing eggs. The hormones in OCPs also cause the cervical mucus to thicken, preventing the sperm from entering the uterus. The hormones also cause the uterine lining to become thin, not allowing a fertilized egg to attach to the inside of the uterus. OCPs are highly effective when taken exactly as prescribed. However, OCPs do not prevent sexually transmitted diseases (STDs). Safe sex practices, such as using condoms along with an OCP, can help prevent STDs.  Before taking OCPs, you may have a physical exam and Pap test. Your health care provider may also order blood tests if necessary. Your health care provider will make sure you are a good candidate for oral contraception. Discuss with your health care provider the possible side effects of the OCP you may be prescribed. When starting an OCP, it can take 2 to 3 months for the body to adjust to the changes in hormone levels in your body. How to take oral contraceptive pills Your health care provider may advise you on how to start taking the first cycle of OCPs. Otherwise, you can:  Start on day 1 of your menstrual period. You will not need any backup contraceptive protection with this start time.  Start on the first Sunday after your menstrual period or the day you get your prescription. In these cases, you will need to use backup contraceptive protection for the first week.  Start the pill at any time of your cycle. If you take the pill within 5 days of the start of your period, you are protected against pregnancy right away. In this case, you will not need a backup form of birth control. If you start at any other time of your menstrual cycle, you will need to use another form of birth control for 7 days. If your OCP is the type called a minipill, it will protect you from pregnancy after taking it for 2 days (48 hours).  After you have started taking OCPs:  If  you forget to take 1 pill, take it as soon as you remember. Take the next pill at the regular time.  If you miss 2 or more pills, call your health care provider because different pills have different instructions for missed doses. Use backup birth control until your next menstrual period starts.  If you use a 28-day pack that contains inactive pills and you miss 1 of the last 7 pills (pills with no hormones), it will not matter. Throw away the rest of the non-hormone pills and start a new pill pack.  No matter which day you start the OCP, you will always start a new pack on that same day of the week. Have an extra pack of OCPs and a backup contraceptive method available in case you miss some pills or lose your OCP pack. Follow these instructions at home:  Do not smoke.  Always use a condom to protect against STDs. OCPs do not protect against STDs.  Use a calendar to mark your menstrual period days.  Read the information and directions that came with your OCP. Talk to your health care provider if you have questions. Contact a health care provider if:  You develop nausea and vomiting.  You have abnormal vaginal discharge or bleeding.  You develop a rash.  You miss your menstrual period.  You are losing your hair.  You need treatment for mood swings or depression.  You get dizzy when taking the OCP.  You develop acne from taking the OCP.  You become pregnant. Get help right away if:  You develop chest pain.  You develop shortness of breath.  You have an uncontrolled or severe headache.  You develop numbness or slurred speech.  You develop visual problems.  You develop pain, redness, and swelling in the legs. This information is not intended to replace advice given to you by your health care provider. Make sure you discuss any questions you have with your health care provider. Document Released: 06/30/2011   Document Revised: 12/17/2015 Document Reviewed:  12/30/2012 [employer] Interactive Patient Education  2017 [employer]  

## 2016-07-26 NOTE — Progress Notes (Signed)
26 y.o. G0P0000 Single  Caucasian Fe here to establish gyn care and  for annual exam. Last aex 12/25/14. Periods very regular, heavy days 3-4 and light and stops. Mild cramping. OCP use for cycle control since 2012. Currently sexually active.. New partner, but screened for STD and never has been treated for one. Sees GI for IBS management. Sees PCP as needed. No other health issues today.  Patient's last menstrual period was 07/05/2016 (approximate).          Sexually active: Yes.    The current method of family planning is OCP. (estrogen/progesterone).    Exercising: Yes.    running, weights, yoga Smoker:  no  Health Maintenance: Pap:  12/25/14 Neg  MMG:  Never Colonoscopy:  Flexible sigmoidoscopy 09/2015: Normal , GI issues from Gallbladder surgery(stones) BMD:   Never TDaP:  2016  Hep C and HIV: Never Labs: PCP   reports that she has never smoked. She has never used smokeless tobacco. She reports that she drinks about 0.6 oz of alcohol per week . She reports that she does not use drugs.  Past Medical History:  Diagnosis Date  . Anxiety   . Asthma    exersize induced  . Exercise induced bronchospasm   . IBS (irritable bowel syndrome)   . Low BP   . PONV (postoperative nausea and vomiting)    patient "passes out when having  blood drawn if not lying down,sometimes passes out anyway"    Past Surgical History:  Procedure Laterality Date  . CHOLECYSTECTOMY N/A 06/24/2014   Procedure: LAPAROSCOPIC CHOLECYSTECTOMY WITH INTRAOPERATIVE CHOLANGIOGRAM ;  Surgeon: Claud KelpHaywood Ingram, MD;  Location: [facility] OR;  Service: General;  Laterality: N/A;  . WISDOM TOOTH EXTRACTION  11    Current Outpatient Prescriptions  Medication Sig Dispense Refill  . albuterol (PROVENTIL HFA;VENTOLIN HFA) 108 (90 BASE) MCG/ACT inhaler Inhale 1-2 puffs into the lungs every 6 (six) hours as needed for wheezing or shortness of breath.    . levonorgestrel-ethinyl estradiol (AVIANE,ALESSE,LESSINA) 0.1-20 MG-MCG tablet Take 1  tablet by mouth daily.    . Nutritional Supplements (ADULT NUTRITIONAL SUPPLEMENT PO) Take by mouth. ARYTHIN supplement to aid digestion    . Probiotic Product (PROBIOTIC-10 PO) Take by mouth daily.     No current facility-administered medications for this visit.     Family History  Problem Relation Age of Onset  . Healthy Mother   . Prostate cancer Father   . Colonic polyp Father   . Healthy Sister   . Colon cancer Neg Hx   . Kidney disease Neg Hx   . Liver disease Neg Hx     ROS:  Pertinent items are noted in HPI.  Otherwise, a comprehensive ROS was negative.  Exam:   BP 110/80 (BP Location: Right Arm, Patient Position: Sitting, Cuff Size: Normal)   Pulse 76   Resp 14   Ht 5' 6.5" (1.689 m)   Wt 131 lb (59.4 kg)   LMP 07/05/2016 (Approximate)   BMI 20.83 kg/m  Height: 5' 6.5" (168.9 cm) Ht Readings from Last 3 Encounters:  07/26/16 5' 6.5" (1.689 m)  06/18/14 5\' 6"  (1.676 m)    General appearance: alert, cooperative and appears stated age Head: Normocephalic, without obvious abnormality, atraumatic Neck: no adenopathy, supple, symmetrical, trachea midline and thyroid normal to inspection and palpation Lungs: clear to auscultation bilaterally Breasts: normal appearance, no masses or tenderness, No nipple retraction or dimpling, No nipple discharge or bleeding, No axillary or supraclavicular adenopathy Heart: regular rate  and rhythm Abdomen: soft, non-tender; no masses,  no organomegaly Extremities: extremities normal, atraumatic, no cyanosis or edema Skin: Skin color, texture, turgor normal. No rashes or lesions Lymph nodes: Cervical, supraclavicular, and axillary nodes normal. No abnormal inguinal nodes palpated Neurologic: Grossly normal   Pelvic: External genitalia:  no lesions              Urethra:  normal appearing urethra with no masses, tenderness or lesions              Bartholin's and Skene's: normal                 Vagina: normal appearing vagina with  normal color and discharge, no lesions              Cervix: no cervical motion tenderness, no lesions and nulliparous appearance              Pap taken: Yes.   Bimanual Exam:  Uterus:  normal size, contour, position, consistency, mobility, non-tender              Adnexa: normal adnexa and no mass, fullness, tenderness               Rectovaginal: Confirms               Anus:  normal sphincter tone, no lesions  Chaperone present: yes  A:  Well Woman with normal exam  Contraception OCP desired for cycle control also  History of Gall stones with cholecystectomy  IBS with GI management  P:   Reviewed health and wellness pertinent to exam  Reviewed risks/benefits and side effects of OCP and need to advise if problems occur.  Rx Orsynthia see order with instructions  Pap smear as above with HPV reflex  Continue follow up with MD as indicated.   counseled on breast self exam, diet and exercise, calcium and vitamin D in diet, STD prevention.  return annually or prn  An After Visit Summary was printed and given to the patient.

## 2016-07-28 LAB — IPS PAP TEST WITH REFLEX TO HPV

## 2016-07-29 NOTE — Progress Notes (Signed)
Encounter reviewed Rasheedah Reis, MD   

## 2016-08-18 ENCOUNTER — Emergency Department (HOSPITAL_BASED_OUTPATIENT_CLINIC_OR_DEPARTMENT_OTHER): Payer: BLUE CROSS/BLUE SHIELD

## 2016-08-18 ENCOUNTER — Emergency Department (HOSPITAL_BASED_OUTPATIENT_CLINIC_OR_DEPARTMENT_OTHER)
Admission: EM | Admit: 2016-08-18 | Discharge: 2016-08-18 | Disposition: A | Discharge status: Home / Self Care | Payer: BLUE CROSS/BLUE SHIELD | Attending: Emergency Medicine | Admitting: Emergency Medicine

## 2016-08-18 ENCOUNTER — Encounter (HOSPITAL_BASED_OUTPATIENT_CLINIC_OR_DEPARTMENT_OTHER): Payer: Self-pay | Admitting: *Deleted

## 2016-08-18 DIAGNOSIS — J45909 Unspecified asthma, uncomplicated: Secondary | ICD-10-CM | POA: Insufficient documentation

## 2016-08-18 DIAGNOSIS — R1031 Right lower quadrant pain: Secondary | ICD-10-CM | POA: Diagnosis present

## 2016-08-18 DIAGNOSIS — Z79899 Other long term (current) drug therapy: Secondary | ICD-10-CM | POA: Diagnosis not present

## 2016-08-18 DIAGNOSIS — N390 Urinary tract infection, site not specified: Secondary | ICD-10-CM | POA: Insufficient documentation

## 2016-08-18 LAB — BASIC METABOLIC PANEL
ANION GAP: 10 (ref 5–15)
BUN: 10 mg/dL (ref 6–20)
CHLORIDE: 101 mmol/L (ref 101–111)
CO2: 22 mmol/L (ref 22–32)
CREATININE: 1.01 mg/dL — AB (ref 0.44–1.00)
Calcium: 9.2 mg/dL (ref 8.9–10.3)
GFR calc non Af Amer: >60 mL/min (ref >60)
Glucose, Bld: 98 mg/dL (ref 65–99)
Potassium: 4 mmol/L (ref 3.5–5.1)
Sodium: 133 mmol/L — ABNORMAL LOW (ref 135–145)

## 2016-08-18 LAB — CBC WITH DIFFERENTIAL/PLATELET
BASOS PCT: 0 %
Basophils Absolute: 0 10*3/uL (ref 0.0–0.1)
Eosinophils Absolute: 0 10*3/uL (ref 0.0–0.7)
Eosinophils Relative: 0 %
HCT: 38.9 % (ref 36.0–46.0)
HEMOGLOBIN: 13 g/dL (ref 12.0–15.0)
Lymphocytes Relative: 9 %
Lymphs Abs: 1.1 10*3/uL (ref 0.7–4.0)
MCH: 28.3 pg (ref 26.0–34.0)
MCHC: 33.4 g/dL (ref 30.0–36.0)
MCV: 84.7 fL (ref 78.0–100.0)
MONOS PCT: 4 %
Monocytes Absolute: 0.5 10*3/uL (ref 0.1–1.0)
NEUTROS ABS: 11.2 10*3/uL — AB (ref 1.7–7.7)
NEUTROS PCT: 87 %
Platelets: 244 10*3/uL (ref 150–400)
RBC: 4.59 MIL/uL (ref 3.87–5.11)
RDW: 12.1 % (ref 11.5–15.5)
WBC: 12.8 10*3/uL — AB (ref 4.0–10.5)

## 2016-08-18 LAB — URINALYSIS, ROUTINE W REFLEX MICROSCOPIC
Bilirubin Urine: NEGATIVE
GLUCOSE, UA: NEGATIVE mg/dL
Nitrite: POSITIVE — AB
Specific Gravity, Urine: 1.02 (ref 1.005–1.030)
pH: 6.5 (ref 5.0–8.0)

## 2016-08-18 LAB — URINALYSIS, MICROSCOPIC (REFLEX)

## 2016-08-18 LAB — PREGNANCY, URINE: Preg Test, Ur: NEGATIVE

## 2016-08-18 MED ORDER — ONDANSETRON HCL 4 MG/2ML IJ SOLN
4.0000 mg | Freq: Once | INTRAMUSCULAR | Status: DC
  Filled 2016-08-18: qty 2

## 2016-08-18 MED ORDER — DEXTROSE 5 % IV SOLN
1.0000 g | Freq: Once | INTRAVENOUS | Status: AC
  Administered 2016-08-18: 1 g via INTRAVENOUS
  Filled 2016-08-18: qty 10

## 2016-08-18 MED ORDER — IOPAMIDOL (ISOVUE-300) INJECTION 61%
100.0000 mL | Freq: Once | INTRAVENOUS | Status: AC | PRN
  Administered 2016-08-18: 100 mL via INTRAVENOUS

## 2016-08-18 MED ORDER — ONDANSETRON HCL 4 MG/2ML IJ SOLN
4.0000 mg | Freq: Once | INTRAMUSCULAR | Status: AC
  Administered 2016-08-18: 4 mg via INTRAVENOUS

## 2016-08-18 MED ORDER — SODIUM CHLORIDE 0.9 % IV BOLUS (SEPSIS)
1000.0000 mL | Freq: Once | INTRAVENOUS | Status: AC
  Administered 2016-08-18: 1000 mL via INTRAVENOUS

## 2016-08-18 MED ORDER — HYDROMORPHONE HCL 1 MG/ML IJ SOLN
0.5000 mg | Freq: Once | INTRAMUSCULAR | Status: AC
  Administered 2016-08-18: 0.5 mg via INTRAVENOUS
  Filled 2016-08-18: qty 1

## 2016-08-18 MED ORDER — CEPHALEXIN 500 MG PO CAPS: 500.0000 mg | ORAL_CAPSULE | Freq: Four times a day (QID) | ORAL | 0 refills | Status: DC

## 2016-08-18 NOTE — ED Triage Notes (Signed)
Abdominal pain today. She was seen by her MD today and told to come here to r/o appendicitis.

## 2016-08-18 NOTE — ED Notes (Signed)
Patient transported to CT 

## 2016-08-18 NOTE — Discharge Instructions (Signed)
See your Physician for recheck in 2-3 days.  Return if any problems. °

## 2016-08-18 NOTE — ED Notes (Signed)
Missed IV attempt x 2 to LAC and left hand, tol well

## 2016-08-18 NOTE — ED Provider Notes (Signed)
[facility] Note   CSN: [id number] Arrival date & time: 08/18/16  1641    By signing my name below, I, Clarisse Gouge, attest that this documentation has been prepared under the direction and in the presence of Ponciano Ort, PA-C. Electronically Signed: Clarisse Gouge, Scribe. 08/18/16. 7:54 PM.   History   Chief Complaint Chief Complaint  Patient presents with  . Abdominal Pain   The history is provided by the patient and medical records. No language interpreter was used.    HPI Comments: Debbie Griffith is a 26 y.o. female who presents to the Emergency Department complaining of sudden onset, gradually worsening, moderate RLQ abdominal pain. She describes her pain as sharp and constant. Triage notes pt was told to come to [facility] ED today by her PCP Piedad Climes of [place] to r/o appendicitis. She states she received an abdominal exam, but their labs take too long to return results. Pt reportsa associated nausea and appetite decrease for solids and liquids. She notes she had her gallbladder removed ~2 years ago but she is otherwise healthy. She further reveals that she drinks socially. Pt denies vomiting and diarrhea, vaginal discharge or any other genitourinary symptoms, Hx of genitourinary symptoms or possible pregnancy d/t birth control pill use.  Past Medical History:  Diagnosis Date  . Anxiety   . Asthma    exersize induced  . Exercise induced bronchospasm   . IBS (irritable bowel syndrome)   . Low BP   . PONV (postoperative nausea and vomiting)    patient "passes out when having  blood drawn if not lying down,sometimes passes out anyway"    Patient Active Problem List   Diagnosis Date Noted  . Exercise-induced bronchospasm 01/14/2016  . Irritable bowel syndrome without diarrhea 01/14/2016  . Asthma, exercise induced 01/01/2016  . History of cholecystectomy 01/01/2016  . Rectal tenesmus 01/01/2016  . Gallstones 06/24/2014    Past Surgical History:    Procedure Laterality Date  . CHOLECYSTECTOMY N/A 06/24/2014   Procedure: LAPAROSCOPIC CHOLECYSTECTOMY WITH INTRAOPERATIVE CHOLANGIOGRAM ;  Surgeon: Claud Kelp, MD;  Location: [facility] OR;  Service: General;  Laterality: N/A;  . WISDOM TOOTH EXTRACTION  11    OB History    Gravida Para Term Preterm AB Living   0 0 0 0 0 0   SAB TAB Ectopic Multiple Live Births   0 0 0 0 0       Home Medications    Prior to Admission medications   Medication Sig Start Date End Date Taking? Authorizing Provider  albuterol (PROVENTIL HFA;VENTOLIN HFA) 108 (90 BASE) MCG/ACT inhaler Inhale 1-2 puffs into the lungs every 6 (six) hours as needed for wheezing or shortness of breath.   Yes Historical Provider, MD  levonorgestrel-ethinyl estradiol (AVIANE,ALESSE,LESSINA) 0.1-20 MG-MCG tablet Take 1 tablet by mouth daily. 07/26/16  Yes Verner Chol, CNM  Nutritional Supplements (ADULT NUTRITIONAL SUPPLEMENT PO) Take by mouth. ARYTHIN supplement to aid digestion   Yes Historical Provider, MD  Probiotic Product (PROBIOTIC-10 PO) Take by mouth daily.   Yes Historical Provider, MD    Family History Family History  Problem Relation Age of Onset  . Healthy Mother   . Prostate cancer Father   . Colonic polyp Father   . Healthy Sister   . Colon cancer Neg Hx   . Kidney disease Neg Hx   . Liver disease Neg Hx     Social History Social History  Substance Use Topics  . Smoking status: Never Smoker  .  Smokeless tobacco: Never Used  . Alcohol use 0.6 oz/week    1 Glasses of wine per week     Comment: occ wine,gin     Allergies   Patient has no known allergies.   Review of Systems Review of Systems  All other systems reviewed and are negative. A complete 10 system review of systems was obtained and all systems are negative except as noted in the HPI and PMH.     Physical Exam Updated Vital Signs BP 134/81 (BP Location: Right Arm)   Pulse 69   Temp 98.3 F (36.8 C) (Oral)   Resp 16   Ht 5\' 7"   (1.702 m)   Wt 131 lb (59.4 kg)   LMP 08/04/2016   SpO2 100%   BMI 20.52 kg/m   Physical Exam  Constitutional: She is oriented to person, place, and time. Vital signs are normal. She appears well-developed and well-nourished.  Non-toxic appearance. No distress.  Afebrile, nontoxic, NAD  HENT:  Head: Normocephalic and atraumatic.  Mouth/Throat: Mucous membranes are normal.  Eyes: Conjunctivae and EOM are normal. Right eye exhibits no discharge. Left eye exhibits no discharge.  Neck: Normal range of motion. Neck supple.  Cardiovascular: Normal rate and intact distal pulses.   Pulmonary/Chest: Effort normal. No respiratory distress.  Abdominal: Normal appearance. She exhibits no distension.  Musculoskeletal: Normal range of motion.  Neurological: She is alert and oriented to person, place, and time. She has normal strength. No sensory deficit.  Skin: Skin is warm, dry and intact. No rash noted.  Psychiatric: She has a normal mood and affect. Her behavior is normal.  Nursing note and vitals reviewed.  Def prox nail loolike scar tiss ED Treatments / Results  DIAGNOSTIC STUDIES: Oxygen Saturation is 100% on RA, normal by my interpretation.    COORDINATION OF CARE: 7:45 PM Discussed treatment plan with pt at bedside and pt agreed to plan. Will order medications for nausea and imaging of the abdomen. Pt advised to treat with soaking at home and warned of possible poor cosmetic healing to affected nail.   Labs (all labs ordered are listed, but only abnormal results are displayed) Labs Reviewed  URINALYSIS, ROUTINE W REFLEX MICROSCOPIC - Abnormal; Notable for the following:       Result Value   APPearance TURBID (*)    Hgb urine dipstick LARGE (*)    Ketones, ur >80 (*)    Protein, ur >300 (*)    Nitrite POSITIVE (*)    Leukocytes, UA LARGE (*)    All other components within normal limits  CBC WITH DIFFERENTIAL/PLATELET - Abnormal; Notable for the following:    WBC 12.8 (*)     Neutro Abs 11.2 (*)    All other components within normal limits  BASIC METABOLIC PANEL - Abnormal; Notable for the following:    Sodium 133 (*)    Creatinine, Ser 1.01 (*)    All other components within normal limits  URINALYSIS, MICROSCOPIC (REFLEX) - Abnormal; Notable for the following:    Bacteria, UA FEW (*)    Squamous Epithelial / LPF 0-5 (*)    All other components within normal limits  PREGNANCY, URINE    EKG  EKG Interpretation None       Radiology No results found.  Procedures Procedures (including critical care time)  Medications Ordered in ED Medications  ondansetron (ZOFRAN) injection 4 mg (not administered)  HYDROmorphone (DILAUDID) injection 0.5 mg (not administered)     Initial Impression / Assessment and  Plan / ED Course  I have reviewed the triage vital signs and the nursing notes.  Pertinent labs & imaging results that were available during my care of the patient were reviewed by me and considered in my medical decision making (see chart for details).       Final Clinical Impressions(s) / ED Diagnoses   Final diagnoses:  Urinary tract infection without hematuria, site unspecified    New Prescriptions Discharge Medication List as of 08/18/2016 11:02 PM    START taking these medications   Details  cephALEXin (KEFLEX) 500 MG capsule Take 1 capsule (500 mg total) by mouth 4 (four) times daily., Starting Thu 08/18/2016, Print      An After Visit Summary was printed and given to the patient.   Lonia Skinner [place], PA-C 08/19/16 1610    Vanetta Mulders, MD 08/22/16 2322

## 2016-08-18 NOTE — ED Notes (Signed)
Completed oral contrast. 

## 2016-08-18 NOTE — ED Notes (Signed)
ED Provider at bedside. 

## 2016-08-18 NOTE — ED Notes (Signed)
Instructed on NPO, verbalized understanding 

## 2016-09-12 DIAGNOSIS — H5213 Myopia, bilateral: Secondary | ICD-10-CM | POA: Diagnosis not present

## 2016-10-26 DIAGNOSIS — F419 Anxiety disorder, unspecified: Secondary | ICD-10-CM | POA: Diagnosis not present

## 2016-10-26 DIAGNOSIS — K5900 Constipation, unspecified: Secondary | ICD-10-CM | POA: Diagnosis not present

## 2016-10-26 DIAGNOSIS — K589 Irritable bowel syndrome without diarrhea: Secondary | ICD-10-CM | POA: Diagnosis not present

## 2016-10-26 DIAGNOSIS — R109 Unspecified abdominal pain: Secondary | ICD-10-CM | POA: Diagnosis not present

## 2016-10-26 DIAGNOSIS — R635 Abnormal weight gain: Secondary | ICD-10-CM | POA: Diagnosis not present

## 2016-12-22 ENCOUNTER — Telehealth: Payer: Self-pay | Admitting: Certified Nurse Midwife

## 2016-12-22 NOTE — Telephone Encounter (Signed)
Patient had an appointment recently with a Psychologist  And would to start her on a medication.  The Psychologist  told her follow up with her OBGYN before starting any medication.

## 2016-12-22 NOTE — Telephone Encounter (Signed)
Spoke with patient. Patient states she has been seeing her psychologist for anxiety with OCD features, was recommended to f/u with GYN prior to starting medications. Patient reports zoloft and prozac discussed. Patient scheduled for OV on 12/23/16 at 2pm with Leota Sauerseborah Leonard, CNM for further discussion and evaluation, patient os agreeable to date and time.  Routing to provider for final review. Patient is agreeable to disposition. Will close encounter.

## 2016-12-23 ENCOUNTER — Encounter: Payer: Self-pay | Admitting: Certified Nurse Midwife

## 2016-12-23 ENCOUNTER — Ambulatory Visit (INDEPENDENT_AMBULATORY_CARE_PROVIDER_SITE_OTHER): Payer: BLUE CROSS/BLUE SHIELD | Admitting: Certified Nurse Midwife

## 2016-12-23 VITALS — BP 104/64 | HR 64 | Resp 16 | Ht 66.5 in | Wt 135.0 lb

## 2016-12-23 DIAGNOSIS — F419 Anxiety disorder, unspecified: Secondary | ICD-10-CM | POA: Diagnosis not present

## 2016-12-23 NOTE — Progress Notes (Signed)
Subjective:     Patient ID: Debbie Griffith, female   DOB: 1991-06-07, 26 y.o.   MRN: [id number]  HPI 26 yo white g0p0 female here to discuss possibility of starting on anti anxiety medication prior to starting graduate school in the fall. Has a long standing relationship with her Psychologist Dr. Evalee JeffersonBarbara Bear who has helped with OCD management. She has recommended she be started on medications such as Zoloft or Prozac and management of OCD. Patient is concerned about being in new place and having episodic changes with anxiety. Has just now starting have better GI issues which have been associated with anxiety. No other health issues today. Currently on probiotic and nutritional supplements and OCP. Periods are normal. No long term relationship. Here for consult only.   Review of Systems pertinent to HPI     Objective:   Physical Exam    Not done Assessment:     History of OCD with GI issues (IBS) Desires medication to help with anxiety.    Plan:     Discussed with patient with her long term OCD issues and anxiety feel she should be managed by Psychiatrist. Patient appreciative of time and recommendation. She feels this has helped understand the precautions with medication. Patient given [facility] information to call. Suggested she obtain records from Psychologist to take with her. Patient will follow with this and advise if any concerns. Rv prn

## 2016-12-27 ENCOUNTER — Encounter: Payer: Self-pay | Admitting: Certified Nurse Midwife

## 2016-12-27 ENCOUNTER — Ambulatory Visit (INDEPENDENT_AMBULATORY_CARE_PROVIDER_SITE_OTHER): Payer: BLUE CROSS/BLUE SHIELD | Admitting: Certified Nurse Midwife

## 2016-12-27 VITALS — BP 110/78 | HR 60 | Temp 98.0°F | Resp 16 | Ht 66.5 in | Wt 135.0 lb

## 2016-12-27 DIAGNOSIS — N3090 Cystitis, unspecified without hematuria: Secondary | ICD-10-CM

## 2016-12-27 LAB — POCT URINALYSIS DIPSTICK
Bilirubin, UA: NEGATIVE
Blood, UA: NEGATIVE
Glucose, UA: NEGATIVE
Ketones, UA: NEGATIVE
Nitrite, UA: NEGATIVE
PROTEIN UA: NEGATIVE
UROBILINOGEN UA: 0.2 U/dL
pH, UA: 8 (ref 5.0–8.0)

## 2016-12-27 MED ORDER — SULFAMETHOXAZOLE-TRIMETHOPRIM 800-160 MG PO TABS: 1.0000 | ORAL_TABLET | Freq: Two times a day (BID) | ORAL | 0 refills | Status: DC

## 2016-12-27 NOTE — Patient Instructions (Signed)

## 2016-12-27 NOTE — Progress Notes (Signed)
26 y.o. Single Caucasian female G0P0000 here with complaint of UTI, with onset  on 3 days ago. Patient complaining of urinary frequency/urgency/ and pain with urination and on finishing urination. Patient denies fever, chills, nausea or back pain. No new personal products or thong.. Patient feels none to sexual activity. Denies any vaginal symptoms. Hs been using Azo OTC for relief.    Contraception is OCP, working well. Patient consumes adequate water intake. History of UTI with ER visit, IV meds. No other health issues today. Patient also has made an appointment with [facility] for evaluation for medication for anxiety. (see note from (12/23/16).  ROS pertinent to above  O: Healthy female WDWN Affect: Normal, orientation x 3 Skin : warm and dry CVAT: negative bilateral Abdomen: slight positive for suprapubic tenderness  Pelvic exam: External genital area: normal, no lesions Bladder,Urethra mildly tender, Urethral meatus: tender, red Vagina: normal vaginal discharge, normal appearance  Wet prep not taken Cervix: normal, non tender Uterus:normal,non tender Adnexa: normal non tender, no fullness or masses   A: UTI Normal pelvic exam Poct urine- WBC 1+, ph. 8.0  P: Reviewed findings consistent of cystitis and need for treatment. ZO:XWRUEAVRx:Bactrim DS see order with instructions WUJ:WJXBJLab:Urine culture due to history Reviewed warning signs and symptoms of UTI and need to advise if occurring. Patient acknowledged information and agreement to call if no change also. Encouraged to limit soda, tea, and coffee and be sure to increase water intake.   RV prn

## 2016-12-29 LAB — URINE CULTURE

## 2017-02-02 DIAGNOSIS — Z7189 Other specified counseling: Secondary | ICD-10-CM | POA: Diagnosis not present

## 2017-02-02 DIAGNOSIS — F411 Generalized anxiety disorder: Secondary | ICD-10-CM | POA: Diagnosis not present

## 2017-02-02 DIAGNOSIS — Z23 Encounter for immunization: Secondary | ICD-10-CM | POA: Diagnosis not present

## 2017-07-13 DIAGNOSIS — F411 Generalized anxiety disorder: Secondary | ICD-10-CM | POA: Diagnosis not present

## 2017-07-27 ENCOUNTER — Ambulatory Visit (INDEPENDENT_AMBULATORY_CARE_PROVIDER_SITE_OTHER): Payer: BLUE CROSS/BLUE SHIELD | Admitting: Certified Nurse Midwife

## 2017-07-27 ENCOUNTER — Other Ambulatory Visit: Payer: Self-pay

## 2017-07-27 ENCOUNTER — Encounter: Payer: Self-pay | Admitting: Certified Nurse Midwife

## 2017-07-27 VITALS — BP 112/68 | HR 60 | Resp 16 | Ht 66.5 in | Wt 141.0 lb

## 2017-07-27 DIAGNOSIS — Z113 Encounter for screening for infections with a predominantly sexual mode of transmission: Secondary | ICD-10-CM | POA: Diagnosis not present

## 2017-07-27 DIAGNOSIS — Z0000 Encounter for general adult medical examination without abnormal findings: Secondary | ICD-10-CM

## 2017-07-27 DIAGNOSIS — Z8659 Personal history of other mental and behavioral disorders: Secondary | ICD-10-CM

## 2017-07-27 DIAGNOSIS — Z3041 Encounter for surveillance of contraceptive pills: Secondary | ICD-10-CM

## 2017-07-27 DIAGNOSIS — Z01419 Encounter for gynecological examination (general) (routine) without abnormal findings: Secondary | ICD-10-CM

## 2017-07-27 DIAGNOSIS — Z8349 Family history of other endocrine, nutritional and metabolic diseases: Secondary | ICD-10-CM | POA: Diagnosis not present

## 2017-07-27 MED ORDER — LEVONORGESTREL-ETHINYL ESTRAD 0.1-20 MG-MCG PO TABS: 1.0000 | ORAL_TABLET | Freq: Every day | ORAL | 4 refills | Status: DC

## 2017-07-27 NOTE — Progress Notes (Signed)
27 y.o. G0P0000 Single  Caucasian Fe here for annual exam. Periods normal,no issues. OCP working well, no warning signs noted. Sexually active, no partner, no STD concerns, but would like to screen for STD's..  Sees Dr. Corliss BlackerMcNeill yearly for management of Asthma. Sees J. Montez Moritaarter for Sertraline management for anxiety and depression. Debbie Griffith is feeling much better now that she is taking medication. Strong family history of Thyroid disease and would like to have lab done. No other health issues today. Bought new coat for herself for Christmas!  Debbie Griffith's last menstrual period was 07/06/2017.          Sexually active: Yes.    The current method of family planning is OCP (estrogen/progesterone).    Exercising: Yes.    running, yoga  Smoker:  no  Health Maintenance: Pap:  12-25-14 neg, 07-26-16 neg History of Abnormal Pap: no MMG:  none Self Breast exams: yes Colonoscopy:  Flex sig 3/17 GI issues BMD:   none TDaP:  2016 Shingles: no Pneumonia: no Hep C and HIV: never Labs: discuss with provider    reports that  has never smoked. she has never used smokeless tobacco. She reports that she drinks about 0.6 oz of alcohol per week. She reports that she does not use drugs.  Past Medical History:  Diagnosis Date  . Anxiety   . Asthma    exersize induced  . Exercise induced bronchospasm   . IBS (irritable bowel syndrome)   . Low BP   . PONV (postoperative nausea and vomiting)    Debbie Griffith "passes out when having  blood drawn if not lying down,sometimes passes out anyway"    Past Surgical History:  Procedure Laterality Date  . CHOLECYSTECTOMY N/A 06/24/2014   Procedure: LAPAROSCOPIC CHOLECYSTECTOMY WITH INTRAOPERATIVE CHOLANGIOGRAM ;  Surgeon: Claud KelpHaywood Ingram, MD;  Location: [facility] OR;  Service: General;  Laterality: N/A;  . WISDOM TOOTH EXTRACTION  11    Current Outpatient Medications  Medication Sig Dispense Refill  . albuterol (PROVENTIL HFA;VENTOLIN HFA) 108 (90 BASE) MCG/ACT inhaler Inhale 1-2  puffs into the lungs every 6 (six) hours as needed for wheezing or shortness of breath.    . levonorgestrel-ethinyl estradiol (AVIANE,ALESSE,LESSINA) 0.1-20 MG-MCG tablet Take 1 tablet by mouth daily. 3 Package 4  . SERTRALINE HCL PO Take 50 mg by mouth daily.     No current facility-administered medications for this visit.     Family History  Problem Relation Age of Onset  . Healthy Mother   . Prostate cancer Father   . Colonic polyp Father   . Healthy Sister   . Colon cancer Neg Hx   . Kidney disease Neg Hx   . Liver disease Neg Hx     ROS:  Pertinent items are noted in HPI.  Otherwise, a comprehensive ROS was negative.  Exam:   BP 112/68   Pulse 60   Resp 16   Ht 5' 6.5" (1.689 m)   Wt 141 lb (64 kg)   LMP 07/06/2017   BMI 22.42 kg/m  Height: 5' 6.5" (168.9 cm) Ht Readings from Last 3 Encounters:  07/27/17 5' 6.5" (1.689 m)  12/27/16 5' 6.5" (1.689 m)  12/23/16 5' 6.5" (1.689 m)    General appearance: alert, cooperative and appears stated age Head: Normocephalic, without obvious abnormality, atraumatic Neck: no adenopathy, supple, symmetrical, trachea midline and thyroid normal to inspection and palpation Lungs: clear to auscultation bilaterally Breasts: normal appearance, no masses or tenderness, No nipple retraction or dimpling, No nipple discharge or  bleeding, No axillary or supraclavicular adenopathy Heart: regular rate and rhythm Abdomen: soft, non-tender; no masses,  no organomegaly Extremities: extremities normal, atraumatic, no cyanosis or edema Skin: Skin color, texture, turgor normal. No rashes or lesions Lymph nodes: Cervical, supraclavicular, and axillary nodes normal. No abnormal inguinal nodes palpated Neurologic: Grossly normal   Pelvic: External genitalia:  no lesions              Urethra:  normal appearing urethra with no masses, tenderness or lesions              Bartholin's and Skene's: normal                 Vagina: normal appearing vagina  with normal color and discharge, no lesions              Cervix: no cervical motion tenderness, no lesions and nulliparous appearance              Pap taken: No. Bimanual Exam:  Uterus:  normal size, contour, position, consistency, mobility, non-tender and anteverted              Adnexa: normal adnexa and no mass, fullness, tenderness               Rectovaginal: Confirms               Anus:  normal appearance  Chaperone present: yes  A:  Well Woman with normal exam  Contraception OCP desired  STD screening  Depression history now on medication with Psychiatry  Strong family history of thyroid disease, requests TSH  P:   Reviewed health and wellness pertinent to exam  Risks/benefits/warning signs reviewed, Debbie Griffith desires continuance  Rx Aviane see order with instructions  Labs: STD panel, Hep C, GC/Chlamydia, Affirm  Continue follow up with MD as indicated  Lab TSH  Pap smear: no   counseled on breast self exam, STD prevention, HIV risk factors and prevention, use and side effects of OCP's, adequate intake of calcium and vitamin D, diet and exercise  return annually or prn  An After Visit Summary was printed and given to the Debbie Griffith.

## 2017-07-27 NOTE — Patient Instructions (Signed)
General topics  Next pap or exam is  due in 1 year Take a Women's multivitamin Take 1200 mg. of calcium daily - prefer dietary If any concerns in interim to call back  Breast Self-Awareness Practicing breast self-awareness may pick up problems early, prevent significant medical complications, and possibly save your life. By practicing breast self-awareness, you can become familiar with how your breasts look and feel and if your breasts are changing. This allows you to notice changes early. It can also offer you some reassurance that your breast health is good. One way to learn what is normal for your breasts and whether your breasts are changing is to do a breast self-exam. If you find a lump or something that was not present in the past, it is best to contact your caregiver right away. Other findings that should be evaluated by your caregiver include nipple discharge, especially if it is bloody; skin changes or reddening; areas where the skin seems to be pulled in (retracted); or new lumps and bumps. Breast pain is seldom associated with cancer (malignancy), but should also be evaluated by a caregiver. BREAST SELF-EXAM The best time to examine your breasts is 5 7 days after your menstrual period is over.  ExitCare Patient Information 2013 [employer].   Exercise to Stay Healthy Exercise helps you become and stay healthy. EXERCISE IDEAS AND TIPS Choose exercises that:  You enjoy.  Fit into your day. You do not need to exercise really hard to be healthy. You can do exercises at a slow or medium level and stay healthy. You can:  Stretch before and after working out.  Try yoga, Pilates, or tai chi.  Lift weights.  Walk fast, swim, jog, run, climb stairs, bicycle, dance, or rollerskate.  Take aerobic classes. Exercises that burn about 150 calories:  Running 1  miles in 15 minutes.  Playing volleyball for 45 to 60 minutes.  Washing and waxing a car for 45 to 60  minutes.  Playing touch football for 45 minutes.  Walking 1  miles in 35 minutes.  Pushing a stroller 1  miles in 30 minutes.  Playing basketball for 30 minutes.  Raking leaves for 30 minutes.  Bicycling 5 miles in 30 minutes.  Walking 2 miles in 30 minutes.  Dancing for 30 minutes.  Shoveling snow for 15 minutes.  Swimming laps for 20 minutes.  Walking up stairs for 15 minutes.  Bicycling 4 miles in 15 minutes.  Gardening for 30 to 45 minutes.  Jumping rope for 15 minutes.  Washing windows or floors for 45 to 60 minutes. Document Released: 08/13/2010 Document Revised: 10/03/2011 Document Reviewed: 08/13/2010 ExitCare Patient Information 2013 [employer]Care[employer].   Other topics ( that may be useful information):    Sexually Transmitted Disease Sexually transmitted disease (STD) refers to any infection that is passed from person to person during sexual activity. This may happen by way of saliva, semen, blood, vaginal mucus, or urine. Common STDs include:  Gonorrhea.  Chlamydia.  Syphilis.  HIV/AIDS.  Genital herpes.  Hepatitis B and C.  Trichomonas.  Human papillomavirus (HPV).  Pubic lice. CAUSES  An STD may be spread by bacteria, virus, or parasite. A person can get an STD by:  Sexual intercourse with an infected person.  Sharing sex toys with an infected person.  Sharing needles with an infected person.  Having intimate contact with the genitals, mouth, or rectal areas of an infected person. SYMPTOMS  Some people may not have any symptoms, but   they can still pass the infection to others. Different STDs have different symptoms. Symptoms include:  Painful or bloody urination.  Pain in the pelvis, abdomen, vagina, anus, throat, or eyes.  Skin rash, itching, irritation, growths, or sores (lesions). These usually occur in the genital or anal area.  Abnormal vaginal discharge.  Penile discharge in men.  Soft, flesh-colored skin growths in the  genital or anal area.  Fever.  Pain or bleeding during sexual intercourse.  Swollen glands in the groin area.  Yellow skin and eyes (jaundice). This is seen with hepatitis. DIAGNOSIS  To make a diagnosis, your caregiver may:  Take a medical history.  Perform a physical exam.  Take a specimen (culture) to be examined.  Examine a sample of discharge under a microscope.  Perform blood test TREATMENT   Chlamydia, gonorrhea, trichomonas, and syphilis can be cured with antibiotic medicine.  Genital herpes, hepatitis, and HIV can be treated, but not cured, with prescribed medicines. The medicines will lessen the symptoms.  Genital warts from HPV can be treated with medicine or by freezing, burning (electrocautery), or surgery. Warts may come back.  HPV is a virus and cannot be cured with medicine or surgery.However, abnormal areas may be followed very closely by your caregiver and may be removed from the cervix, vagina, or vulva through office procedures or surgery. If your diagnosis is confirmed, your recent sexual partners need treatment. This is true even if they are symptom-free or have a negative culture or evaluation. They should not have sex until their caregiver says it is okay. HOME CARE INSTRUCTIONS  All sexual partners should be informed, tested, and treated for all STDs.  Take your antibiotics as directed. Finish them even if you start to feel better.  Only take over-the-counter or prescription medicines for pain, discomfort, or fever as directed by your caregiver.  Rest.  Eat a balanced diet and drink enough fluids to keep your urine clear or pale yellow.  Do not have sex until treatment is completed and you have followed up with your caregiver. STDs should be checked after treatment.  Keep all follow-up appointments, Pap tests, and blood tests as directed by your caregiver.  Only use latex condoms and water-soluble lubricants during sexual activity. Do not use  petroleum jelly or oils.  Avoid alcohol and illegal drugs.  Get vaccinated for HPV and hepatitis. If you have not received these vaccines in the past, talk to your caregiver about whether one or both might be right for you.  Avoid risky sex practices that can break the skin. The only way to avoid getting an STD is to avoid all sexual activity.Latex condoms and dental dams (for oral sex) will help lessen the risk of getting an STD, but will not completely eliminate the risk. SEEK MEDICAL CARE IF:   You have a fever.  You have any new or worsening symptoms. Document Released: 10/01/2002 Document Revised: 10/03/2011 Document Reviewed: 10/08/2010 [facility] Patient Information 2013 [employer].    Domestic Abuse You are being battered or abused if someone close to you hits, pushes, or physically hurts you in any way. You also are being abused if you are forced into activities. You are being sexually abused if you are forced to have sexual contact of any kind. You are being emotionally abused if you are made to feel worthless or if you are constantly threatened. It is important to remember that help is available. No one has the right to abuse you. PREVENTION OF FURTHER  ABUSE  Learn the warning signs of danger. This varies with situations but may include: the use of alcohol, threats, isolation from friends and family, or forced sexual contact. Leave if you feel that violence is going to occur.  If you are attacked or beaten, report it to the police so the abuse is documented. You do not have to press charges. The police can protect you while you or the attackers are leaving. Get the officer's name and badge number and a copy of the report.  Find someone you can trust and tell them what is happening to you: your caregiver, a nurse, clergy member, close friend or family member. Feeling ashamed is natural, but remember that you have done nothing wrong. No one deserves abuse. Document Released:  07/08/2000 Document Revised: 10/03/2011 Document Reviewed: 09/16/2010 ExitCare Patient Information 2013 [employer]Care[employer].    How Much is Too Much Alcohol? Drinking too much alcohol can cause injury, accidents, and health problems. These types of problems can include:   Car crashes.  Falls.  Family fighting (domestic violence).  Drowning.  Fights.  Injuries.  Burns.  Damage to certain organs.  Having a baby with birth defects. ONE DRINK CAN BE TOO MUCH WHEN YOU ARE:  Working.  Pregnant or breastfeeding.  Taking medicines. Ask your doctor.  Driving or planning to drive. If you or someone you know has a drinking problem, get help from a doctor.  Document Released: 05/07/2009 Document Revised: 10/03/2011 Document Reviewed: 05/07/2009 ExitCare Patient Information 2013 ExitCare[employer].   Smoking Hazards Smoking cigarettes is extremely bad for your health. Tobacco smoke has over 200 known poisons in it. There are over 60 chemicals in tobacco smoke that cause cancer. Some of the chemicals found in cigarette smoke include:   Cyanide.  Benzene.  Formaldehyde.  Methanol (wood alcohol).  Acetylene (fuel used in welding torches).  Ammonia. Cigarette smoke also contains the poisonous gases nitrogen oxide and carbon monoxide.  Cigarette smokers have an increased risk of many serious medical problems and Smoking causes approximately:  90% of all lung cancer deaths in men.  80% of all lung cancer deaths in women.  90% of deaths from chronic obstructive lung disease. Compared with nonsmokers, smoking increases the risk of:  Coronary heart disease by 2 to 4 times.  Stroke by 2 to 4 times.  Men developing lung cancer by 23 times.  Women developing lung cancer by 13 times.  Dying from chronic obstructive lung diseases by 12 times.  . Smoking is the most preventable cause of death and disease in our society.  WHY IS SMOKING ADDICTIVE?  Nicotine is the chemical  agent in tobacco that is capable of causing addiction or dependence.  When you smoke and inhale, nicotine is absorbed rapidly into the bloodstream through your lungs. Nicotine absorbed through the lungs is capable of creating a powerful addiction. Both inhaled and non-inhaled nicotine may be addictive.  Addiction studies of cigarettes and spit tobacco show that addiction to nicotine occurs mainly during the teen years, when young people begin using tobacco products. WHAT ARE THE BENEFITS OF QUITTING?  There are many health benefits to quitting smoking.   Likelihood of developing cancer and heart disease decreases. Health improvements are seen almost immediately.  Blood pressure, pulse rate, and breathing patterns start returning to normal soon after quitting. QUITTING SMOKING   American Lung Association - 1-800-LUNGUSA  American Cancer Society - 1-800-ACS-2345 Document Released: 08/18/2004 Document Revised: 10/03/2011 Document Reviewed: 04/22/2009 ExitCare Patient Information 2013 ExitCare,   LLC.   Stress Management Stress is a state of physical or mental tension that often results from changes in your life or normal routine. Some common causes of stress are:  Death of a loved one.  Injuries or severe illnesses.  Getting fired or changing jobs.  Moving into a new home. Other causes may be:  Sexual problems.  Business or financial losses.  Taking on a large debt.  Regular conflict with someone at home or at work.  Constant tiredness from lack of sleep. It is not just bad things that are stressful. It may be stressful to:  Win the lottery.  Get married.  Buy a new car. The amount of stress that can be easily tolerated varies from person to person. Changes generally cause stress, regardless of the types of change. Too much stress can affect your health. It may lead to physical or emotional problems. Too little stress (boredom) may also become stressful. SUGGESTIONS TO  REDUCE STRESS:  Talk things over with your family and friends. It often is helpful to share your concerns and worries. If you feel your problem is serious, you may want to get help from a professional counselor.  Consider your problems one at a time instead of lumping them all together. Trying to take care of everything at once may seem impossible. List all the things you need to do and then start with the most important one. Set a goal to accomplish 2 or 3 things each day. If you expect to do too many in a single day you will naturally fail, causing you to feel even more stressed.  Do not use alcohol or drugs to relieve stress. Although you may feel better for a short time, they do not remove the problems that caused the stress. They can also be habit forming.  Exercise regularly - at least 3 times per week. Physical exercise can help to relieve that "uptight" feeling and will relax you.  The shortest distance between despair and hope is often a good night's sleep.  Go to bed and get up on time allowing yourself time for appointments without being rushed.  Take a short "time-out" period from any stressful situation that occurs during the day. Close your eyes and take some deep breaths. Starting with the muscles in your face, tense them, hold it for a few seconds, then relax. Repeat this with the muscles in your neck, shoulders, hand, stomach, back and legs.  Take good care of yourself. Eat a balanced diet and get plenty of rest.  Schedule time for having fun. Take a break from your daily routine to relax. HOME CARE INSTRUCTIONS   Call if you feel overwhelmed by your problems and feel you can no longer manage them on your own.  Return immediately if you feel like hurting yourself or someone else. Document Released: 01/04/2001 Document Revised: 10/03/2011 Document Reviewed: 08/27/2007 ExitCare Patient Information 2013 ExitCare, LLC.   

## 2017-07-28 LAB — VAGINITIS/VAGINOSIS, DNA PROBE
Candida Species: NEGATIVE
GARDNERELLA VAGINALIS: NEGATIVE
TRICHOMONAS VAG: NEGATIVE

## 2017-07-29 LAB — HEPATITIS C ANTIBODY: Hep C Virus Ab: <0.1 s/co ratio (ref 0.0–0.9)

## 2017-07-29 LAB — CHLAMYDIA/GC NAA, CONFIRMATION
Chlamydia trachomatis, NAA: NEGATIVE
Neisseria gonorrhoeae, NAA: NEGATIVE

## 2017-07-29 LAB — HEP, RPR, HIV PANEL
HIV Screen 4th Generation wRfx: NONREACTIVE
Hepatitis B Surface Ag: NEGATIVE
RPR Ser Ql: NONREACTIVE

## 2017-07-29 LAB — TSH: TSH: 2.33 u[IU]/mL (ref 0.450–4.500)

## 2017-08-18 ENCOUNTER — Other Ambulatory Visit: Payer: Self-pay | Admitting: Certified Nurse Midwife

## 2017-08-18 DIAGNOSIS — Z3041 Encounter for surveillance of contraceptive pills: Secondary | ICD-10-CM

## 2017-08-18 NOTE — Telephone Encounter (Signed)
Message left to return call to Abrahim Sargent at [phone].    

## 2017-08-18 NOTE — Telephone Encounter (Signed)
Message left to return call to Kevyn Wengert at [phone].    

## 2017-08-18 NOTE — Telephone Encounter (Signed)
Patient returning call.

## 2017-08-21 NOTE — Telephone Encounter (Signed)
Medication refill request: OCP Last AEX:  07/27/17 DL  Next AEX: 1/61/091/14/20  Last MMG (if hormonal medication request): n/a Refill authorized: Today, please advise.   Spoke with patient. Patient states that she has not yet received her birth control from being sent in on 07/27/17. Patient states the pharmacy requesting the new prescription is the correct one for her [facility] mail [facility].

## 2017-08-21 NOTE — Telephone Encounter (Signed)
Patient returned call to Emily. °

## 2017-08-21 NOTE — Telephone Encounter (Signed)
Detailed message left per DPR for patient to return call to confirm pharmacy.

## 2017-08-22 NOTE — Telephone Encounter (Signed)
We need to call her pharmacy, because Rx is in chart and shown dated the day she was here

## 2017-08-22 NOTE — Telephone Encounter (Signed)
Detailed message left per DPR letting patient know Debbie Griffith, CNM sent RX to correct [facility]. Advised to return call if any issues or additional questions.   Routing to provider for final review. Patient agreeable to disposition. Will close encounter.

## 2017-08-22 NOTE — Telephone Encounter (Signed)
Call to Pharmacy, spoke with Tobi BastosAnna, Pharmacist. Tobi Bastosnna states that prescription was sent to them as they are a mail service, but her insurance will not cover RX through mail service. Tobi Bastosnna advised [facility] which Tobi [facility]osnna confirmed [facility] [phone] [place], [place]. Advised would update provider.   Debbi, I have [facility] after speaking with Pharmacist. Please advise.

## 2018-03-01 DIAGNOSIS — Z0000 Encounter for general adult medical examination without abnormal findings: Secondary | ICD-10-CM | POA: Diagnosis not present

## 2018-03-01 DIAGNOSIS — Z13228 Encounter for screening for other metabolic disorders: Secondary | ICD-10-CM | POA: Diagnosis not present

## 2018-03-01 DIAGNOSIS — R5383 Other fatigue: Secondary | ICD-10-CM | POA: Diagnosis not present

## 2018-03-01 DIAGNOSIS — Z131 Encounter for screening for diabetes mellitus: Secondary | ICD-10-CM | POA: Diagnosis not present

## 2018-03-01 DIAGNOSIS — F411 Generalized anxiety disorder: Secondary | ICD-10-CM | POA: Diagnosis not present

## 2018-03-01 DIAGNOSIS — Z13220 Encounter for screening for lipoid disorders: Secondary | ICD-10-CM | POA: Diagnosis not present

## 2018-04-13 IMAGING — CT CT ABD-PELV W/ CM
2 of 4 series · 16 of 46 positions shown, 18 images · IV contrast (APPLIED)
Comparison: None.

CLINICAL DATA: Patient with right lower quadrant pain for 1 day.

EXAM:
CT ABDOMEN AND PELVIS WITH CONTRAST
TECHNIQUE: Multidetector CT imaging of the abdomen and pelvis was performed
using the standard protocol following bolus administration of
intravenous contrast.
CONTRAST:  100mL [id number] IOPAMIDOL ([id number]) INJECTION 61%

[Series 2: axial st · axial · 0.89mm/px · z∈[-478,-38]mm · 13 of 97 slices shown, 15 images]
[im 5/97  soft-tissue]
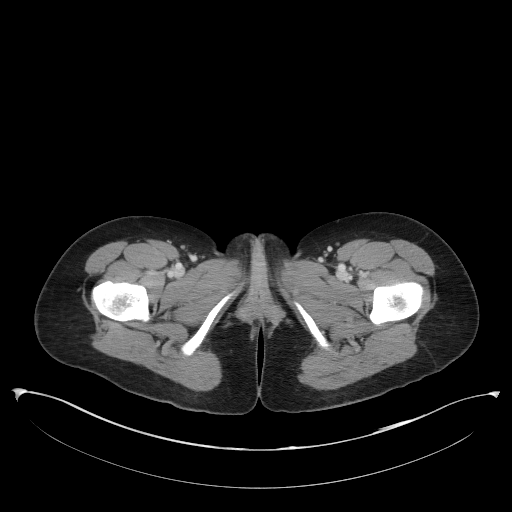
[im 5/97  bone]
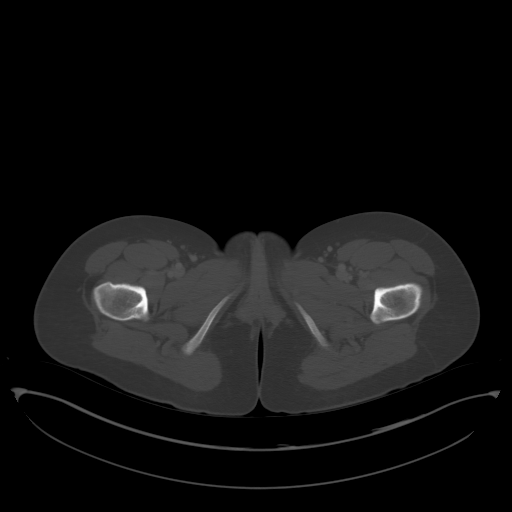
[im 13/97  soft-tissue]
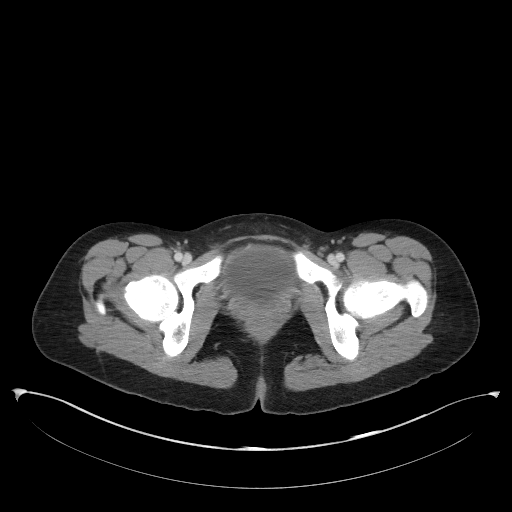
[im 21/97  soft-tissue]
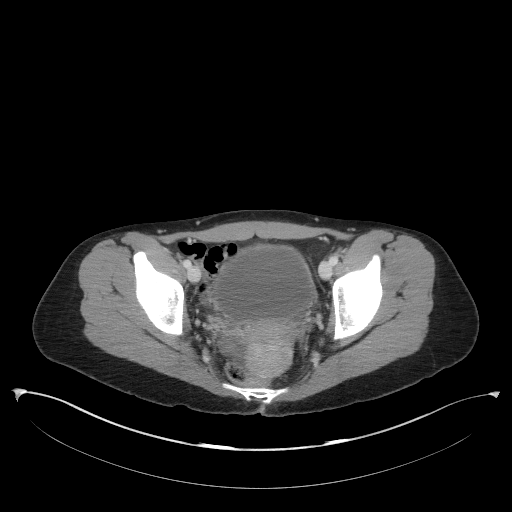
[im 29/97  soft-tissue]
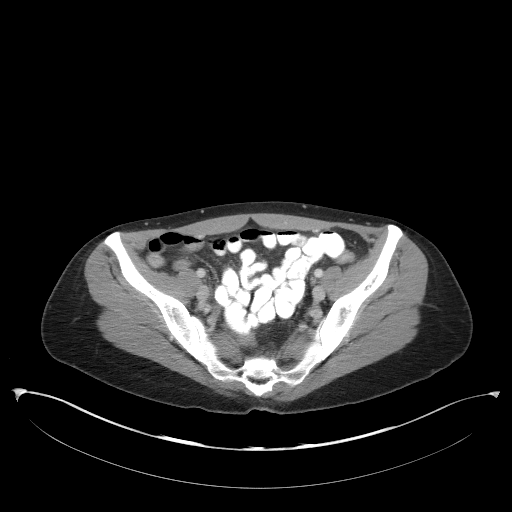
[im 33/97  soft-tissue]
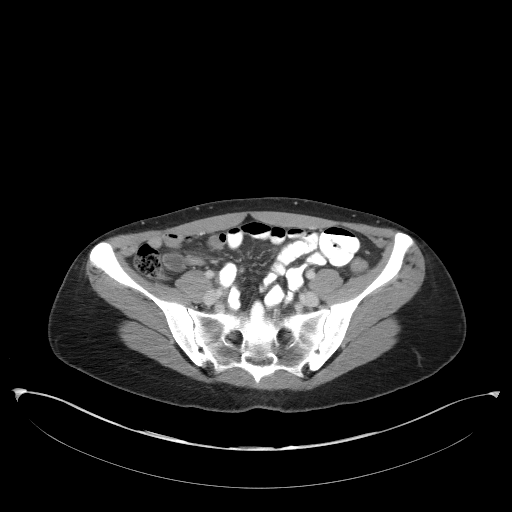
[im 41/97  soft-tissue]
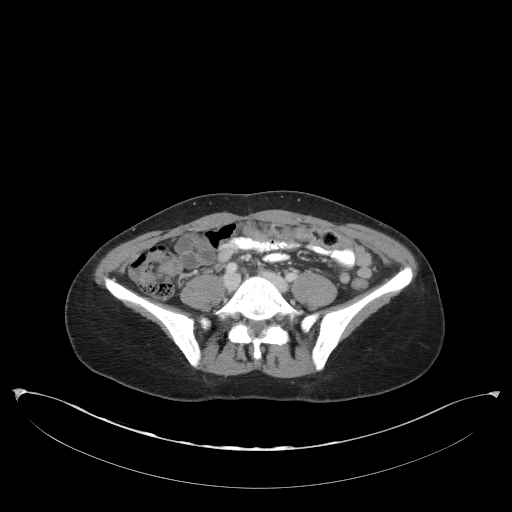
[im 49/97  soft-tissue]
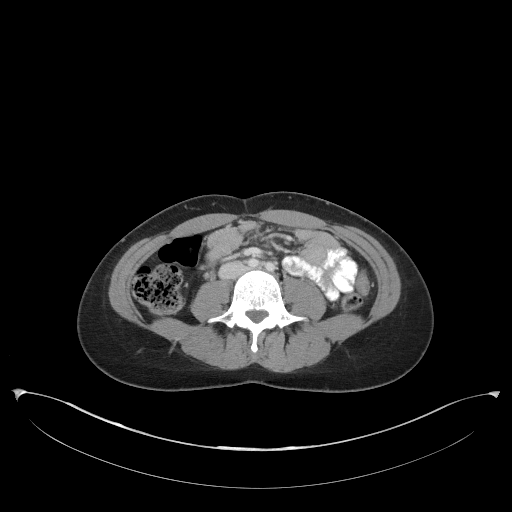
[im 57/97  soft-tissue]
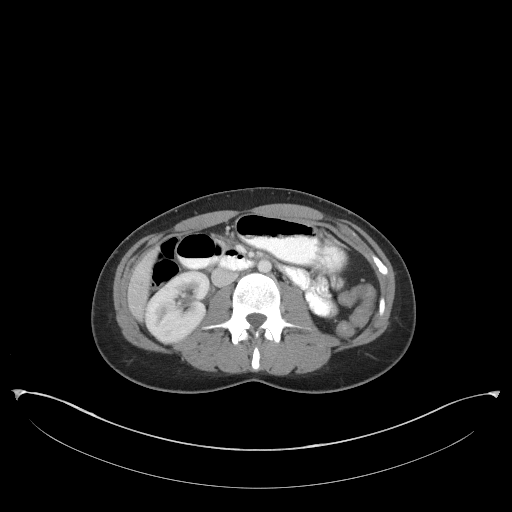
[im 65/97  soft-tissue]
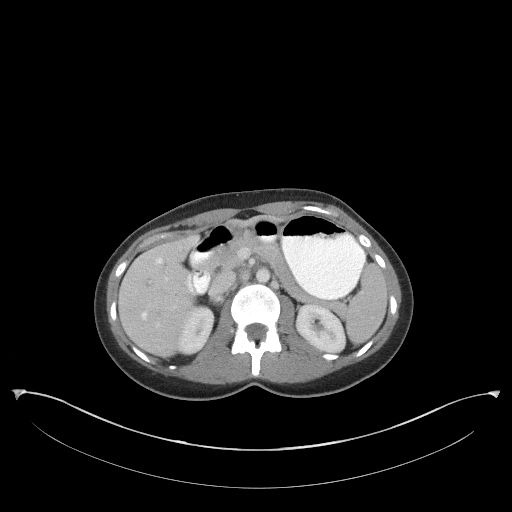
[im 65/97  bone]
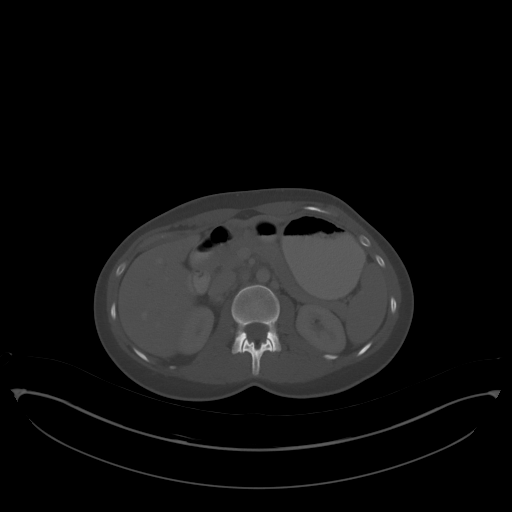
[im 69/97  soft-tissue]
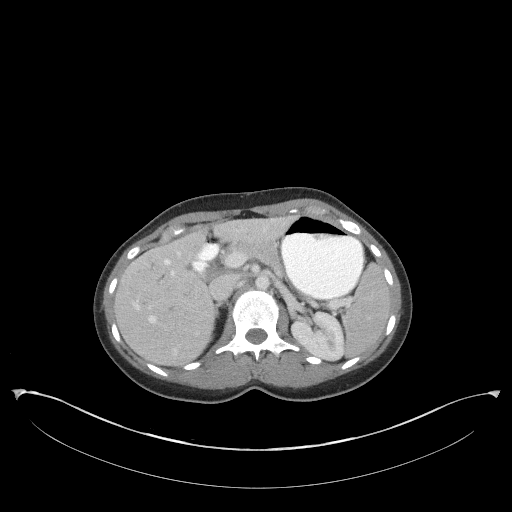
[im 77/97  soft-tissue]
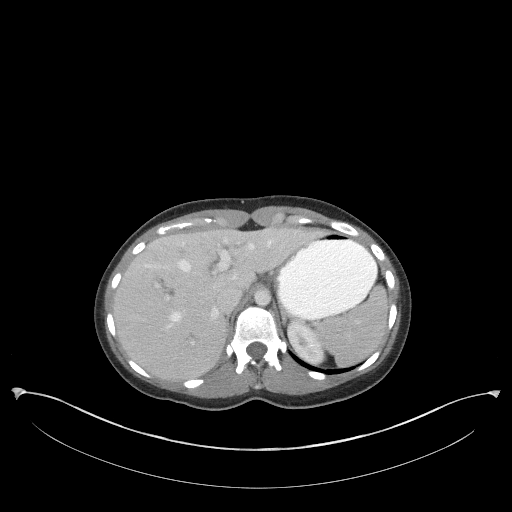
[im 85/97  soft-tissue]
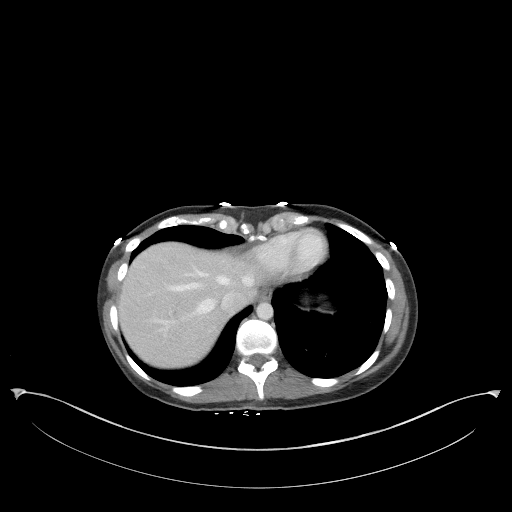
[im 93/97  soft-tissue]
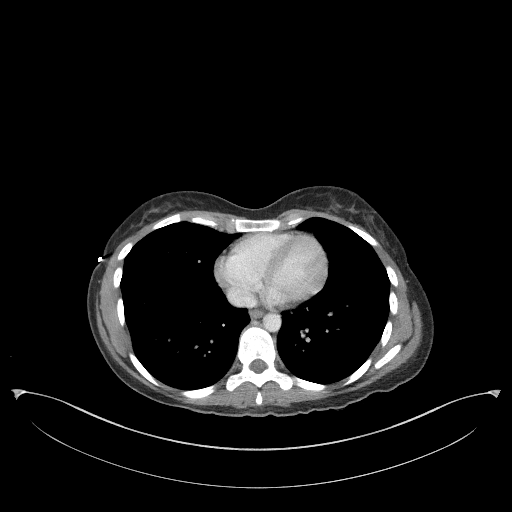

[Series 5: coronal st · coronal · 0.74mm/px · 3 of 61 slices shown]
[im 21/61  soft-tissue]
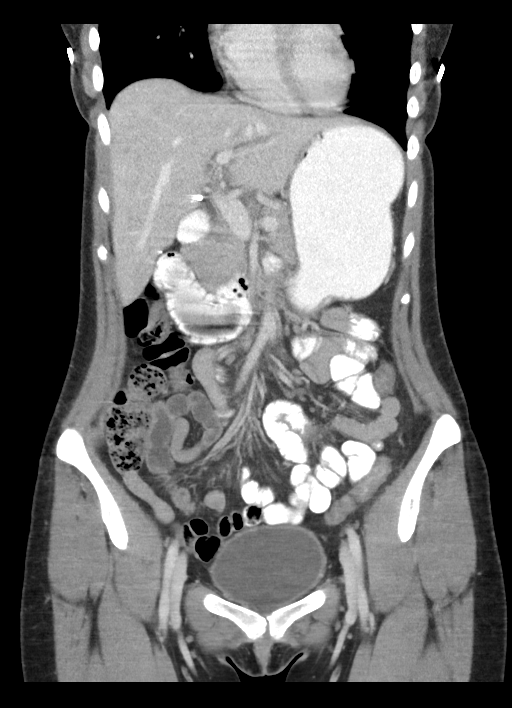
[im 27/61  soft-tissue]
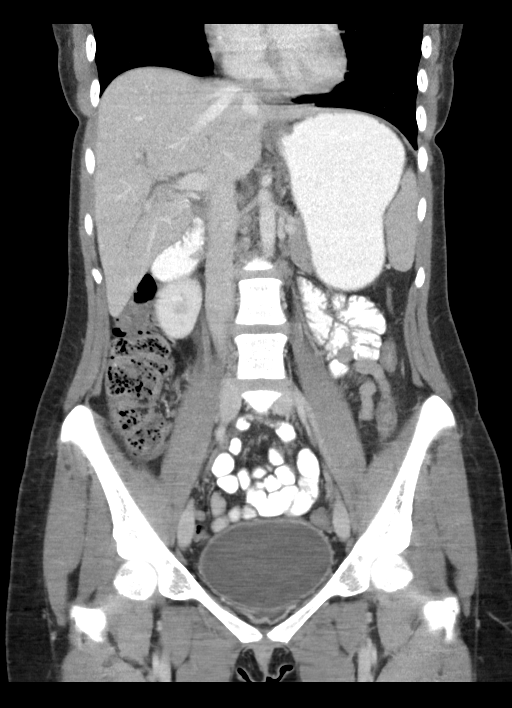
[im 34/61  soft-tissue]
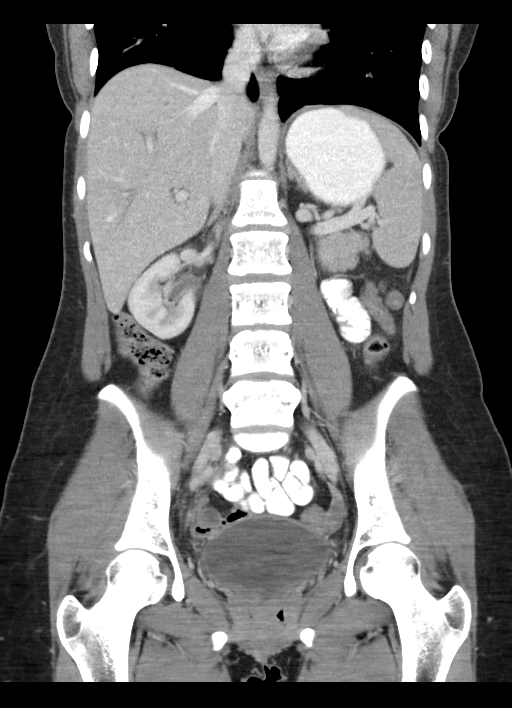

[16 of 46 positions shown; findings below may reference images not displayed]

FINDINGS: Lower chest: Normal heart size. No consolidative pulmonary
opacities. Minimal atelectasis right lower lobe. No pleural
effusion.

Hepatobiliary: The liver is normal in size and contour. Patient
status post cholecystectomy. Intrahepatic biliary ductal dilatation.
Common bile duct is normal in diameter.

Pancreas: Unremarkable

Spleen: Unremarkable

Adrenals/Urinary Tract: Adrenal glands are unremarkable. Kidneys
enhance symmetrically with contrast. There is urothelial enhancement
of the right ureter and small amount of periureteric fat stranding.
There is mild wall thickening of the urinary bladder.

Stomach/Bowel: No abnormal bowel wall thickening or evidence for
bowel obstruction. No free fluid or free intraperitoneal air. The
appendix appears normal.

Vascular/Lymphatic: Normal caliber abdominal aorta. No
retroperitoneal lymphadenopathy.

Reproductive: Uterus and adnexal structures are unremarkable. The
uterus is retroverted.

Other: None.

Musculoskeletal: No aggressive or acute appearing osseous lesions.
IMPRESSION: There is urothelial enhancement of the right ureter with surrounding
fat stranding. Additionally there is suggestion of wall thickening
of the urinary bladder. Findings are concerning for cystitis as well
as ascending tract infection within the right ureter.

Patient status post cholecystectomy. Mild intrahepatic biliary
ductal dilatation. Recommend correlation with LFTs.

## 2018-05-14 ENCOUNTER — Telehealth: Payer: Self-pay | Admitting: Psychiatry

## 2018-05-14 ENCOUNTER — Other Ambulatory Visit: Payer: Self-pay

## 2018-05-14 MED ORDER — SERTRALINE HCL 50 MG PO TABS: 75.0000 mg | ORAL_TABLET | Freq: Every day | ORAL | 0 refills | Status: DC

## 2018-05-14 NOTE — Telephone Encounter (Signed)
Pt called and left VM stating she needs a medication refill and that pharmacy told her to contact office. Please call pt at [phone] to find out more information and which pharmacy she would like it sent to. Also, please let her know she was last seen 07/13/17 and needs to try to come in, since she needs to be seen at least yearly.

## 2018-05-14 NOTE — Telephone Encounter (Signed)
Patient called back to schedule ov 07/09/2018 At school uses [facility] [place] [facility]  Sertraline 50mg  1.5 tablets every morning

## 2018-05-14 NOTE — Telephone Encounter (Signed)
Left voicemail for pt to call back with more information.

## 2018-06-20 DIAGNOSIS — F411 Generalized anxiety disorder: Secondary | ICD-10-CM | POA: Diagnosis not present

## 2018-07-06 DIAGNOSIS — F411 Generalized anxiety disorder: Secondary | ICD-10-CM | POA: Diagnosis not present

## 2018-07-09 ENCOUNTER — Ambulatory Visit: Payer: Self-pay | Admitting: Psychiatry

## 2018-08-03 ENCOUNTER — Other Ambulatory Visit: Payer: Self-pay

## 2018-08-03 ENCOUNTER — Other Ambulatory Visit: Payer: Self-pay | Admitting: Psychiatry

## 2018-08-03 MED ORDER — SERTRALINE HCL 50 MG PO TABS: 75.0000 mg | ORAL_TABLET | Freq: Every day | ORAL | 0 refills | Status: AC

## 2018-08-07 ENCOUNTER — Ambulatory Visit: Payer: BLUE CROSS/BLUE SHIELD | Admitting: Certified Nurse Midwife

## 2018-08-08 DIAGNOSIS — F411 Generalized anxiety disorder: Secondary | ICD-10-CM | POA: Diagnosis not present

## 2019-10-16 ENCOUNTER — Encounter: Payer: Self-pay | Admitting: Certified Nurse Midwife
